# Patient Record
Sex: Female | Born: 1965 | Race: White | Hispanic: No | Marital: Married | Smoking: Former smoker
Health system: Southern US, Community
[De-identification: ages and names within clinical notes are randomized; demographics above are authoritative.]

## PROBLEM LIST (undated history)

## (undated) DIAGNOSIS — F32A Depression, unspecified: Secondary | ICD-10-CM

## (undated) DIAGNOSIS — K219 Gastro-esophageal reflux disease without esophagitis: Secondary | ICD-10-CM

## (undated) DIAGNOSIS — Z8719 Personal history of other diseases of the digestive system: Secondary | ICD-10-CM

## (undated) DIAGNOSIS — F329 Major depressive disorder, single episode, unspecified: Secondary | ICD-10-CM

## (undated) DIAGNOSIS — F419 Anxiety disorder, unspecified: Secondary | ICD-10-CM

## (undated) DIAGNOSIS — A64 Unspecified sexually transmitted disease: Secondary | ICD-10-CM

## (undated) DIAGNOSIS — E039 Hypothyroidism, unspecified: Secondary | ICD-10-CM

## (undated) HISTORY — PX: DIAGNOSTIC LAPAROSCOPY: SUR761

## (undated) HISTORY — PX: DILATION AND CURETTAGE OF UTERUS: SHX78

## (undated) HISTORY — DX: Unspecified sexually transmitted disease: A64

---

## 1999-06-14 HISTORY — PX: TUBAL LIGATION: SHX77

## 2007-09-17 ENCOUNTER — Other Ambulatory Visit: Admission: RE | Admit: 2007-09-17 | Discharge: 2007-09-17 | Payer: Self-pay | Admitting: Obstetrics and Gynecology

## 2010-04-02 DIAGNOSIS — K219 Gastro-esophageal reflux disease without esophagitis: Secondary | ICD-10-CM | POA: Insufficient documentation

## 2010-04-27 ENCOUNTER — Encounter: Payer: Self-pay | Admitting: Internal Medicine

## 2010-04-27 ENCOUNTER — Ambulatory Visit: Payer: Self-pay | Admitting: Gastroenterology

## 2010-04-27 DIAGNOSIS — R1013 Epigastric pain: Secondary | ICD-10-CM

## 2010-04-27 DIAGNOSIS — R1319 Other dysphagia: Secondary | ICD-10-CM | POA: Insufficient documentation

## 2010-05-10 ENCOUNTER — Ambulatory Visit (HOSPITAL_COMMUNITY): Admission: RE | Admit: 2010-05-10 | Discharge: 2010-05-10 | Payer: Self-pay | Admitting: Internal Medicine

## 2010-05-10 ENCOUNTER — Ambulatory Visit: Payer: Self-pay | Admitting: Internal Medicine

## 2010-05-11 DIAGNOSIS — Z9889 Other specified postprocedural states: Secondary | ICD-10-CM | POA: Insufficient documentation

## 2010-05-11 DIAGNOSIS — Q394 Esophageal web: Secondary | ICD-10-CM | POA: Insufficient documentation

## 2010-05-26 ENCOUNTER — Telehealth (INDEPENDENT_AMBULATORY_CARE_PROVIDER_SITE_OTHER): Payer: Self-pay

## 2010-07-01 ENCOUNTER — Telehealth (INDEPENDENT_AMBULATORY_CARE_PROVIDER_SITE_OTHER): Payer: Self-pay

## 2010-07-13 NOTE — Letter (Signed)
Summary: REFERRAL FROM DR Maryellen Pile  REFERRAL FROM DR EASON   Imported By: Rexene Alberts 04/27/2010 16:03:51  _____________________________________________________________________  External Attachment:    Type:   Image     Comment:   External Document

## 2010-07-13 NOTE — Letter (Signed)
Summary: EGD/ED ORDER  EGD/ED ORDER   Imported By: Rexene Alberts 04/27/2010 14:58:05  _____________________________________________________________________  External Attachment:    Type:   Image     Comment:   External Document

## 2010-07-15 NOTE — Assessment & Plan Note (Signed)
Summary: [facility] PAIN/LAW   Visit Type:  Initial Consult Referring Isaish Alemu:  [facility] Ashla Murph:  Kathlee Nations  CC:  Dysphagia and epigastric pain.  History of Present Illness: Stefanie Brown is a nice 45 year old female who presents today at the request of Dr. Kathlee Nations secondary to new onset of epigastric pain and dysphagia. 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 She reports being evaluated for cardiac etiology, which was negative. The last 2.5 months she notes the pain now resides in the epigastric region, and she has noted new onset of reflux. reports choking sensation despite chewing well and choosing soft foods. +dysphagia/odonyphagia. started zantac bid 2 mos ago without improvement. denies regular use of NSAIDs, ASA, goody's, bc. No other issues. no constipation, hematochezia, or melena. Feels that symptoms are worse during the day at school. under a lot of stress. See tests below:  UGI Oct 2011: small h.h. with GERD, otherwise negative [place] abd Oct 2011: negative for stones  Current Medications (verified): 1)  Levothroid 50 Mcg Tabs (Levothyroxine Sodium) .... Take 1 Tablet By Mouth Once A Day 2)  Prevacid 24hr 15 Mg Cpdr (Lansoprazole) .... Take 1 Tablet By Mouth Once A Day  Allergies (verified): 1)  ! * Bactrum 2)  ! Sulfa  Past History:  Past Medical History: Hypothyroidism  Past Surgical History: tubal ligation  Family History: Mother:living, Parkinsons Father:living, MI, prostate ca Siblings: 1 sister fibromyalgia No FH of Colon Cancer:  Social History: Occupation: Runner, broadcasting/film/video, 7th grade Patient is a former smoker. quit 2 years, six cigarettes/day Alcohol Use - yes, social Illicit Drug Use - no One son: 20 years oldSmoking Status:  quit Drug Use:  no  Review of Systems General:  Denies fever, chills, and anorexia. Eyes:   Denies irritation and discharge. ENT:  Complains of difficulty swallowing; denies sore throat and hoarseness. CV:  Denies chest pains and peripheral edema. Resp:  Denies dyspnea at rest, cough, and wheezing. GI:  Complains of difficulty swallowing, pain on swallowing, and abdominal pain; denies gas/bloating, constipation, change in bowel habits, bloody BM's, and black BMs. GU:  Denies urinary burning, blood in urine, and urinary frequency. MS:  Denies joint pain / LOM, joint swelling, and joint stiffness. Derm:  Denies rash, itching, and dry skin. Neuro:  Denies weakness, paralysis, and syncope. Psych:  Denies depression and anxiety. Endo:  Denies cold intolerance and heat intolerance. Heme:  Denies bruising, bleeding, and enlarged lymph nodes. Allergy:  Denies hives, hay fever, and recurrent infections.   Vital Signs:  Patient profile:   45 year old female Height:      67 inches Weight:      202 pounds BMI:     31.75 Temp:     99.2 degrees F oral Pulse rate:   60 / minute BP sitting:   118 / 88  (left arm) Cuff size:   regular  Vitals Entered By: Cloria Spring LPN (April 27, 2010 1:24 PM)  Physical Exam  General:  Well developed, well nourished, no acute distress. Head:  Normocephalic and atraumatic. Eyes:  without scleral icterus Mouth:  No deformity or lesions, dentition normal. Lungs:  Clear throughout to auscultation. Heart:  Regular rate and rhythm; no murmurs, rubs,  or bruits. Abdomen:  normal bowel sounds, obese, without guarding, without rebound, no distesion, no tenderness, and no hepatomegally or splenomegaly.   Msk:  Symmetrical  with no gross deformities. Normal posture. Pulses:  Normal pulses noted. Extremities:  No clubbing, cyanosis, edema or deformities noted. Neurologic:  Alert and  oriented x4;  grossly normal neurologically. Skin:  Intact without significant lesions or rashes. Psych:  Alert and cooperative. Normal mood and affect.  Impression &  Recommendations:  Problem # 1:  ABDOMINAL PAIN-EPIGASTRIC (ICD-27.24)  45 year old pleasant Caucasian female with new onset of abdominal pain in March, now mainly epigastric pain, worsened over past 2.5 months, +dysphagia/odynophagia, reflux X 1 week, started on Zantac 150 two times a day two months ago, no resolution of symptoms or lessening of severity. UGI showed hiatal hernia, [place] no stones or other etiology. no prior egd in past. no hx of reflux/dysphagia.  EGD withposs ED with Dr. Jena Gauss: the risks and benefits were discussed in detail, and she desires to proceed. verbal consent obtained. Nexium 40 mg by mouth daily, 2 weeks worth given  Orders: Consultation Level III ([id number])  Problem # 2:  DYSPHAGIA (EXB-284.13)  see # 1.  Orders: Consultation Level III ([id number])  Problem # 3:  GERD (ICD-530.81)  see # 1.   Orders: Consultation Level III [phone]) We would like to thank Dr. Kathlee Nations for the referral of this pleasant lady. Future recommendations will be discussed after EGD with possible dilation.  Appended Document: Orders Update    Clinical Lists Changes  Orders: Added new Service order of Consultation Level III [phone]) - Signed

## 2010-07-15 NOTE — Progress Notes (Signed)
Summary: nexium not helping  Phone Note Call from Patient Call back at Home Phone [phone]   Caller: Patient Summary of Call: pt called- she is having increased burning in chest and throat. She stated it feels like she is getting alot worse. She feels like the Nexium is not helping at all. pt wants to know if there is something else she can try other than the Nexium? please advise Initial call taken by: Hendricks Limes LPN,  July 01, 2010 11:43 AM     Appended Document: nexium not helping OK; we can try some Dexilant 60 mg cap - one cap daily x 2 week trial - then telephone f/u  Appended Document: nexium not helping called pt- left detailed message on cell #. samples at front desk  Appended Document: nexium not helping pt called- she will try samples

## 2010-07-15 NOTE — Progress Notes (Signed)
Summary: nexium rx  Phone Note Call from Patient Call back at Home Phone [phone]   Caller: Patient Summary of Call: pt is requesting Nexium rx called to [facility] pharmacy in [place]. She is out comepletely of samples and needs rx today if possible. pt is requesting a call to let her know when its called in.  please advise Initial call taken by: Hendricks Limes LPN,  May 26, 2010 4:10 PM     Appended Document: nexium rx ok; nexium 40 mg daily - disp #30 - 40 mg once daily. refill x 6  Appended Document: nexium rx Called Rx to [facility] in [place]. LMOM for pt that it was called in.

## 2010-07-26 ENCOUNTER — Telehealth (INDEPENDENT_AMBULATORY_CARE_PROVIDER_SITE_OTHER): Payer: Self-pay

## 2010-08-02 ENCOUNTER — Ambulatory Visit: Payer: Self-pay | Admitting: Gastroenterology

## 2010-08-02 ENCOUNTER — Telehealth (INDEPENDENT_AMBULATORY_CARE_PROVIDER_SITE_OTHER): Payer: Self-pay | Admitting: *Deleted

## 2010-08-04 NOTE — Progress Notes (Signed)
Summary: pt wants ov- dexilant not helping  Phone Note Call from Patient Call back at [facility] Phone [phone]   Caller: Patient Summary of Call: pt called- Dexilant is not helping all day either, by 9pm pt is in so much pain in her throat that she starts crying. pt is requesting an appt. 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 pt stated she has never tried any of her ppi's two times a day because they were always too expensive (pt is paying 70.00 a month for once daily dosing) please advise Initial call taken by: Hendricks Limes LPN,  July 26, 2010 12:28 PM     Appended Document: pt wants ov- dexilant not helping Lets get her in  the 17th or 20 th.  Appended Document: pt wants ov- dexilant not helping LMOM for pt to come in on 2/20 @ 230pm w/AS (3pm urgent spot)

## 2010-08-10 NOTE — Progress Notes (Addendum)
  Phone Note Call from Patient   Summary of Call: Pt called to cancel her appt. She stated that she wanted to try the Dexilant for another month and needs [place] to call in [facility] [place], the # is [phone]. Pt can be reached at [phone].  Initial call taken by: Peggyann Shoals,  August 02, 2010 12:02 PM     Appended Document: dexilant  Needs OV 1 month  Prescriptions: DEXILANT 60 MG CPDR (DEXLANSOPRAZOLE) 1 by mouth daily  #31 x 1   Entered and Authorized by:   Joselyn Arrow FNP-BC   Signed by:   Joselyn Arrow FNP-BC on 08/02/2010   Method used:   [facility]  [address] [phone][facility] (retail)       [address]       [place], [place]  [address]       Ph: [phone]       Fax: [phone]   RxID:   [id number]     Appended Document:  mailed letter for pt to call office to set up her OV to further her refills

## 2010-08-16 ENCOUNTER — Encounter: Payer: Self-pay | Admitting: Gastroenterology

## 2010-08-24 NOTE — Medication Information (Signed)
Summary: RX Folder  RX Folder   Imported By: Peggyann Shoals 08/16/2010 12:58:33  _____________________________________________________________________  External Attachment:    Type:   Image     Comment:   External Document

## 2010-08-27 ENCOUNTER — Encounter (INDEPENDENT_AMBULATORY_CARE_PROVIDER_SITE_OTHER): Payer: Self-pay | Admitting: *Deleted

## 2010-08-31 NOTE — Letter (Signed)
Summary: Recall Office Visit  [facility]  [address]   [place], [place] [address]   Phone: [phone]  Fax: [phone]      August 27, 2010   SHARVI MOONEYHAN [address] [place], [place]  [address] [birth year]   Dear Ms. Okelley,   According to our records, it is time for you to schedule a follow-up office visit with [place].   At your convenience, please call [phone] to schedule an office visit. If you have any questions, concerns, or feel that this letter is in error, we would appreciate your call.   Sincerely,    Diana Eves  [facility] Ph: [phone]   Fax: [phone]

## 2012-12-20 DIAGNOSIS — I10 Essential (primary) hypertension: Secondary | ICD-10-CM | POA: Insufficient documentation

## 2013-02-06 ENCOUNTER — Ambulatory Visit (INDEPENDENT_AMBULATORY_CARE_PROVIDER_SITE_OTHER): Payer: BC Managed Care – PPO | Admitting: Certified Nurse Midwife

## 2013-02-06 ENCOUNTER — Encounter: Payer: Self-pay | Admitting: Certified Nurse Midwife

## 2013-02-06 VITALS — BP 126/82 | HR 92 | Ht 67.0 in | Wt 204.0 lb

## 2013-02-06 DIAGNOSIS — Z01419 Encounter for gynecological examination (general) (routine) without abnormal findings: Secondary | ICD-10-CM

## 2013-02-06 NOTE — Patient Instructions (Addendum)

## 2013-02-06 NOTE — Progress Notes (Signed)
Patient ID: Stefanie Brown, female   DOB: 18-Jul-1965, 47 y.o.   MRN: [id number] 47 y.o. [id number] MarriedCaucasianF here for annual exam.   Periods duration has increased to 5 days with cramping for first 2 days only. Moderate bleeding days 1 and 2 only. Desires no cycle control. OTC NSAID works well.  Sees PCP for aex, labs and thyroid management. No other health issues today.  Patient's last menstrual period was 12/14/2012.          Sexually active: yes  The current method of family planning is tubal ligation.    Exercising: yes  Swimming occasionally Smoker:  Former, quit in 2010  Health Maintenance: Pap:  02/02/12, WNL neg HR HPV History of abnormal Pap:  no MMG:  01/09/13, BI-Rads 1 negative Colonoscopy:  never BMD:   never TDaP:  2007 Screening Labs: PCP, Hb today: PCPP, Urine today: PCP   reports that she quit smoking about 4 years ago. Her smoking use included Cigarettes. She has a 12.5 pack-year smoking history. She does not have any smokeless tobacco history on file. She reports that she drinks about 1.5 ounces of alcohol per week. She reports that she does not use illicit drugs.  Past Medical History  Diagnosis Date  . Thyroid disease     Hypothyroid  . STD (sexually transmitted disease)     HSV 2    Past Surgical History  Procedure Laterality Date  . Tubal ligation Bilateral 2001    Current Outpatient Prescriptions  Medication Sig Dispense Refill  . cetirizine (ZYRTEC) 10 MG tablet Take 10 mg by mouth daily.      Marland Kitchen levothyroxine (SYNTHROID, LEVOTHROID) 75 MCG tablet Take 1 tablet by mouth daily.       No current facility-administered medications for this visit.    Family History  Problem Relation Age of Onset  . Parkinson's disease Mother   . Hypertension Father   . Heart attack Father     ROS:  Pertinent items are noted in HPI.  Otherwise, a comprehensive ROS was negative.  Exam:   BP 126/82  Pulse 92  Ht 5\' 7"  (1.702 m)  Wt 204 lb (92.534 kg)  BMI 31.94  kg/m2  LMP 12/14/2012  Weight change:  Height:   Height: 5\' 7"  (170.2 cm)  Ht Readings from Last 3 Encounters:  02/06/13 5\' 7"  (1.702 m)  04/27/10 5\' 7"  (1.702 m)    General appearance: alert, cooperative and appears stated age Head: Normocephalic, without obvious abnormality, atraumatic Neck: no adenopathy, supple, symmetrical, trachea midline and thyroid normal to inspection and palpation Lungs: clear to auscultation bilaterally Breasts: normal appearance, no masses or tenderness, No nipple retraction or dimpling, No nipple discharge or bleeding, No axillary or supraclavicular adenopathy Heart: regular rate and rhythm Abdomen: soft, non-tender; bowel sounds normal; no masses,  no organomegaly Extremities: extremities normal, atraumatic, no cyanosis or edema Skin: Skin color, texture, turgor normal. No rashes or lesions Lymph nodes: Cervical, supraclavicular, and axillary nodes normal. No abnormal inguinal nodes palpated Neurologic: Grossly normal   Pelvic: External genitalia:  no lesions              Urethra:  normal appearing urethra with no masses, tenderness or lesions              Bartholins and Skenes: normal                 Vagina: normal appearing vagina with normal color and discharge, no lesions  Cervix: normal, non tender              Pap taken: no Bimanual Exam:  Uterus:  normal size, contour, position, consistency, mobility, non-tender and mid position              Adnexa: normal adnexa and no mass, fullness, tenderness               Rectovaginal: Confirms               Anus:  normal sphincter tone, no lesions  A:  Well Woman with normal exam  Hypothyroid stable medication with PCP management    P:   Reviewed health and wellness pertinent to exam  Discussed perimenopausal cycle changes, etiology, bleeding profile. Given menses calendar to record with normal and abnormal parameters.  Continue follow as indicated   Mammogram yearly pap smear not taken  today  counseled on breast self exam, mammography screening, adequate intake of calcium and vitamin D, diet and exercise, Kegel's exercises  return annually or prn  An After Visit Summary was printed and given to the patient.

## 2013-02-07 NOTE — Progress Notes (Signed)
Note reviewed, agree with plan.  Lylee Corrow, MD  

## 2013-04-29 ENCOUNTER — Encounter: Payer: Self-pay | Admitting: Certified Nurse Midwife

## 2013-09-11 ENCOUNTER — Telehealth: Payer: Self-pay | Admitting: Certified Nurse Midwife

## 2013-09-11 NOTE — Telephone Encounter (Signed)
Spoke with patient. Patient has been experiencing pain in both armpits for a few months with some itching. Has recently quit smoking and has gained some weight as a result. Pain was originally only coming right before cycle but is now coming daily. Patient states "I feel it may be hormonal and I may be about to go through menopause." Has not switched soaps or laundry detergents. Is not currently on any form of birth control. Has been taking advil occasionally to relieve pain. Offered appointment with Regina Eck CNM as early as today or tomorrow. Patient states that she is out of town and will not be back until Saturday. Requesting late afternoon appointment. Appointment made for 4/7 at 1600 with Regina Eck CNM. Patient is agreeable and verbalizes understanding.  Routing to provider for final review. Patient agreeable to disposition. Will close encounter

## 2013-09-11 NOTE — Telephone Encounter (Signed)
Pt says she is having a lot of pain in both armpits.

## 2013-09-17 ENCOUNTER — Ambulatory Visit (INDEPENDENT_AMBULATORY_CARE_PROVIDER_SITE_OTHER): Payer: BC Managed Care – PPO | Admitting: Certified Nurse Midwife

## 2013-09-17 ENCOUNTER — Encounter: Payer: Self-pay | Admitting: Certified Nurse Midwife

## 2013-09-17 VITALS — BP 110/68 | HR 72 | Resp 16 | Ht 67.0 in | Wt 207.0 lb

## 2013-09-17 DIAGNOSIS — N644 Mastodynia: Secondary | ICD-10-CM

## 2013-09-17 NOTE — Progress Notes (Signed)
48 y.o. Married Caucasian female G2P1011 here with complaint of pain under both arms, for the past two months. Denies breast masses, or nipple discharge or skin change. Patient was swimming for exercise four months ago and she stopped swimming about the time the soreness became worse. Patient wears under wire bra and admits to weight gain over the past few months, which has made her bra tighter. Patient denies redness or swelling under arms or lesions. "It just feels sore". Patient took Advil a few days ago for discomfort and "it did help". Denies any heavy lifting or injury, or excess caffeine use.. Last mammogram 01/09/2013 negative. Mother recently diagnosed with bladder cancer and patient traveling a lot to help her. No other issues.   O: Healthy WD,WN female Affect: Normal Breast exam bilateral: no masses, skin change, nipple discharge or asymmetry noted. No tenderness noted except on the very outer edge of breast leading to the axilla. Axilla bilateral tender, no redness, no axillary lymph node enlargement, no lesions, no edema noted. ROM of arms does cause tenderness under arms bilateral.  A:Mastalgia with axillary tenderness Normal breast and axillary exam except for tenderness.   P: Discussed findings of normal exam with tenderness only. Discussed could be exercise or under wire compression as causative agent. Discussed removed under wire in same bra and see if discomfort improves. Also start on Advil 600 mg every 6 hours for the next 24 to see if improvement noted. Avoid heavy lifting. Recommend diagnostic mammogram due breast involvement. Patient agreeable. Will schedule diagnostic mammogram and notify patient of appointment.  Rv prn

## 2013-09-17 NOTE — Patient Instructions (Signed)
Breast Tenderness Breast tenderness is a common problem for women of all ages. Breast tenderness may cause mild discomfort to severe pain. It has a variety of causes. Your health care provider will find out the likely cause of your breast tenderness by examining your breasts, asking you about symptoms, and ordering some tests. Breast tenderness usually does not mean you have breast cancer. HOME CARE INSTRUCTIONS  Breast tenderness often can be handled at home. You can try:  Getting fitted for a new bra that provides more support, especially during exercise.  Wearing a more supportive bra or sports bra while sleeping when your breasts are very tender.  If you have a breast injury, apply ice to the area:  Put ice in a plastic bag.  Place a towel between your skin and the bag.  Leave the ice on for 20 minutes, 2 3 times a day.  If your breasts are too full of milk as a result of breastfeeding, try:  Expressing milk either by hand or with a breast pump.  Applying a warm compress to the breasts for relief.  Taking over-the-counter pain relievers, if approved by your health care provider.  Taking other medicines that your health care provider prescribes. These may include antibiotic medicines or birth control pills. Over the long term, your breast tenderness might be eased if you:  Cut down on caffeine.  Reduce the amount of fat in your diet. Keep a log of the days and times when your breasts are most tender. This will help you and your health care provider find the cause of the tenderness and how to relieve it. Also, learn how to do breast exams at home. This will help you notice if you have an unusual growth or lump that could cause tenderness. SEEK MEDICAL CARE IF:   Any part of your breast is hard, red, and hot to the touch. This could be a sign of infection.  Fluid is coming out of your nipples (and you are not breastfeeding). Especially watch for blood or pus.  You have a fever  as well as breast tenderness.  You have a new or painful lump in your breast that remains after your menstrual period ends.  You have tried to take care of the pain at home, but it has not gone away.  Your breast pain is getting worse, or the pain is making it hard to do the things you usually do during your day. Document Released: 05/12/2008 Document Revised: 01/30/2013 Document Reviewed: 12/27/2012 ExitCare Patient Information 2014 [employer].  

## 2013-09-18 ENCOUNTER — Telehealth: Payer: Self-pay | Admitting: Emergency Medicine

## 2013-09-18 NOTE — Telephone Encounter (Signed)
Called Stefanie Brown and scheduled patient for 4/9 at 2:45 at [place]. [address], [place] phone is [phone]. Order faxed.  Message left to return call to [place] at [phone].

## 2013-09-18 NOTE — Telephone Encounter (Signed)
Attempted phone call to patient and no answer.

## 2013-09-18 NOTE — Telephone Encounter (Signed)
Message copied by Michele Mcalpine on Wed Sep 18, 2013  1:07 PM ------      Message from: Regina Eck      Created: Tue Sep 17, 2013  5:42 PM       Please schedule diagnostic mammogram for [place]      Bilateral breast tenderness persistent with axillary invovement      See order      Thank you ------

## 2013-09-18 NOTE — Progress Notes (Signed)
Reviewed personally.  M. Suzanne Angas Isabell, MD.  

## 2013-09-19 NOTE — Telephone Encounter (Signed)
Message left to return call to [place] at [phone].   Unable to reach patient. [facility] and they will leave appointment as it is for now, patient will not be charged a no show fee. I will continue to try to reach her and r/s as necessary.

## 2013-09-24 NOTE — Telephone Encounter (Signed)
Spoke with [facility] Rep, they state patient is scheduled for 4/15 at 1500.

## 2013-10-04 NOTE — Telephone Encounter (Signed)
Spoke with Judeen Hammans at [place], requested copies of imaging to be faxed.

## 2013-10-04 NOTE — Telephone Encounter (Signed)
Message copied by Michele Mcalpine on Fri Oct 04, 2013  1:22 PM ------      Message from: Regina Eck      Created: Tue Sep 17, 2013  5:42 PM       Please schedule diagnostic mammogram for Stefanie Brown      Bilateral breast tenderness persistent with axillary invovement      See order      Thank you ------

## 2013-10-08 NOTE — Telephone Encounter (Signed)
Copies of images to Regina Eck CNM to review.   Will close encounter.

## 2013-10-10 ENCOUNTER — Telehealth: Payer: Self-pay | Admitting: Emergency Medicine

## 2013-10-10 NOTE — Telephone Encounter (Signed)
Calling patient to discuss written result note from [place], reviewed mammogram report and no abnormality seen. Would like to do two month breast check.   Patient states she has changed bras and is now "75 percent better". Advised of message from Debbi and patient asks to call back to schedule follow up breast check. 2 month recall entered. Will route hard copies to Dr. Sabra Heck for review out of hold.   Routing to provider for final review. Patient agreeable to disposition. Will close encounter

## 2013-10-11 NOTE — Telephone Encounter (Signed)
agree

## 2013-11-09 ENCOUNTER — Emergency Department (HOSPITAL_COMMUNITY): Payer: BC Managed Care – PPO

## 2013-11-09 ENCOUNTER — Emergency Department (HOSPITAL_COMMUNITY)
Admission: EM | Admit: 2013-11-09 | Discharge: 2013-11-09 | Disposition: A | Discharge status: Home / Self Care | Payer: BC Managed Care – PPO | Attending: Emergency Medicine | Admitting: Emergency Medicine

## 2013-11-09 ENCOUNTER — Encounter (HOSPITAL_COMMUNITY): Payer: Self-pay | Admitting: Emergency Medicine

## 2013-11-09 DIAGNOSIS — R109 Unspecified abdominal pain: Secondary | ICD-10-CM

## 2013-11-09 DIAGNOSIS — R319 Hematuria, unspecified: Secondary | ICD-10-CM | POA: Insufficient documentation

## 2013-11-09 DIAGNOSIS — R350 Frequency of micturition: Secondary | ICD-10-CM | POA: Insufficient documentation

## 2013-11-09 DIAGNOSIS — E039 Hypothyroidism, unspecified: Secondary | ICD-10-CM | POA: Insufficient documentation

## 2013-11-09 DIAGNOSIS — Z79899 Other long term (current) drug therapy: Secondary | ICD-10-CM | POA: Insufficient documentation

## 2013-11-09 DIAGNOSIS — R300 Dysuria: Secondary | ICD-10-CM | POA: Insufficient documentation

## 2013-11-09 DIAGNOSIS — Z87891 Personal history of nicotine dependence: Secondary | ICD-10-CM | POA: Insufficient documentation

## 2013-11-09 LAB — COMPREHENSIVE METABOLIC PANEL
ALBUMIN: 3.8 g/dL (ref 3.5–5.2)
ALK PHOS: 75 U/L (ref 39–117)
ALT: 22 U/L (ref 0–35)
AST: 15 U/L (ref 0–37)
BILIRUBIN TOTAL: 0.3 mg/dL (ref 0.3–1.2)
BUN: 10 mg/dL (ref 6–23)
CHLORIDE: 104 meq/L (ref 96–112)
CO2: 25 mEq/L (ref 19–32)
Calcium: 9.7 mg/dL (ref 8.4–10.5)
Creatinine, Ser: 0.71 mg/dL (ref 0.50–1.10)
GFR calc Af Amer: >90 mL/min (ref >90)
GFR calc non Af Amer: >90 mL/min (ref >90)
Glucose, Bld: 90 mg/dL (ref 70–99)
POTASSIUM: 3.7 meq/L (ref 3.7–5.3)
Sodium: 143 mEq/L (ref 137–147)
Total Protein: 7.1 g/dL (ref 6.0–8.3)

## 2013-11-09 LAB — CBC WITH DIFFERENTIAL/PLATELET
BASOS PCT: 0 % (ref 0–1)
Basophils Absolute: 0 10*3/uL (ref 0.0–0.1)
Eosinophils Absolute: 0 10*3/uL (ref 0.0–0.7)
Eosinophils Relative: 1 % (ref 0–5)
HCT: 37.5 % (ref 36.0–46.0)
HEMOGLOBIN: 12.8 g/dL (ref 12.0–15.0)
LYMPHS ABS: 1.9 10*3/uL (ref 0.7–4.0)
LYMPHS PCT: 23 % (ref 12–46)
MCH: 27.9 pg (ref 26.0–34.0)
MCHC: 34.1 g/dL (ref 30.0–36.0)
MCV: 81.7 fL (ref 78.0–100.0)
MONOS PCT: 7 % (ref 3–12)
Monocytes Absolute: 0.6 10*3/uL (ref 0.1–1.0)
NEUTROS ABS: 5.8 10*3/uL (ref 1.7–7.7)
NEUTROS PCT: 69 % (ref 43–77)
Platelets: 220 10*3/uL (ref 150–400)
RBC: 4.59 MIL/uL (ref 3.87–5.11)
RDW: 13.1 % (ref 11.5–15.5)
WBC: 8.3 10*3/uL (ref 4.0–10.5)

## 2013-11-09 LAB — URINALYSIS, ROUTINE W REFLEX MICROSCOPIC
Bilirubin Urine: NEGATIVE
GLUCOSE, UA: NEGATIVE mg/dL
KETONES UR: 15 mg/dL — AB
LEUKOCYTES UA: NEGATIVE
Nitrite: NEGATIVE
PROTEIN: NEGATIVE mg/dL
Specific Gravity, Urine: 1.019 (ref 1.005–1.030)
Urobilinogen, UA: 1 mg/dL (ref 0.0–1.0)
pH: 6 (ref 5.0–8.0)

## 2013-11-09 LAB — URINE MICROSCOPIC-ADD ON

## 2013-11-09 MED ORDER — CEPHALEXIN 500 MG PO CAPS: 500.0000 mg | ORAL_CAPSULE | Freq: Four times a day (QID) | ORAL | Status: DC

## 2013-11-09 MED ORDER — CEPHALEXIN 250 MG PO CAPS
500.0000 mg | ORAL_CAPSULE | Freq: Once | ORAL | Status: AC
  Administered 2013-11-09: 500 mg via ORAL
  Filled 2013-11-09: qty 2

## 2013-11-09 MED ORDER — TRAMADOL HCL 50 MG PO TABS: 50.0000 mg | ORAL_TABLET | Freq: Four times a day (QID) | ORAL | Status: DC | PRN

## 2013-11-09 NOTE — ED Provider Notes (Signed)
CSN: [id number]     Arrival date & time 11/09/13  1746 History   First MD Initiated Contact with Patient 11/09/13 1818     Chief Complaint  Patient presents with  . Flank Pain  . Abdominal Pain     (Consider location/radiation/quality/duration/timing/severity/associated sxs/prior Treatment) Patient is a 48 y.o. female presenting with flank pain and abdominal pain. The history is provided by the patient.  Flank Pain Associated symptoms include abdominal pain. Pertinent negatives include no chest pain, no headaches and no shortness of breath.  Abdominal Pain Associated symptoms: dysuria, hematuria and nausea   Associated symptoms: no chest pain, no fever, no shortness of breath and no vomiting    the patient is from the [place] area. Patient's had several months of bilateral flank pain predominant right flank pain right lower abdominal pain. Patient was seen by her regular doctor yesterday and was told that she had some blood in her urine. Patient was referred to urology. Patient reports nausea but no vomiting. Patient's appetite and decreased. Patient's had urinary frequency and burning with urination. No gross hematuria. Pain is been intermittent. No fevers.  Past Medical History  Diagnosis Date  . Thyroid disease     Hypothyroid  . STD (sexually transmitted disease)     HSV 2   Past Surgical History  Procedure Laterality Date  . Tubal ligation Bilateral 2001   Family History  Problem Relation Age of Onset  . Parkinson's disease Mother   . Cancer Mother     bladder  . Hypertension Father   . Heart attack Father    History  Substance Use Topics  . Smoking status: Former Smoker -- 0.50 packs/day for 25 years    Types: Cigarettes    Quit date: 06/13/2008  . Smokeless tobacco: Not on file  . Alcohol Use: 1.5 oz/week    3 drink(s) per week   OB History   Grav Para Term Preterm Abortions TAB SAB Ect Mult Living   2 1 1  1  1   1      Review of Systems   Constitutional: Positive for appetite change. Negative for fever.  HENT: Negative for congestion.   Eyes: Negative for photophobia.  Respiratory: Negative for shortness of breath.   Cardiovascular: Negative for chest pain and leg swelling.  Gastrointestinal: Positive for nausea and abdominal pain. Negative for vomiting.  Genitourinary: Positive for dysuria, frequency, hematuria and flank pain.  Musculoskeletal: Positive for back pain. Negative for neck pain.  Skin: Negative for rash.  Neurological: Negative for headaches.  Hematological: Does not bruise/bleed easily.  Psychiatric/Behavioral: Negative for confusion.      Allergies  Sulfamethoxazole-trimethoprim and Sulfonamide derivatives  Home Medications   Prior to Admission medications   Medication Sig Start Date End Date Taking? Authorizing Provider  cetirizine (ZYRTEC) 10 MG tablet Take 10 mg by mouth daily.   Yes Historical Provider, MD  ibuprofen (ADVIL,MOTRIN) 200 MG tablet Take 600 mg by mouth every 6 (six) hours as needed for moderate pain.   Yes Historical Provider, MD  levothyroxine (SYNTHROID, LEVOTHROID) 75 MCG tablet Take 75 mcg by mouth daily before breakfast.  01/28/13  Yes Historical Provider, MD  omeprazole (PRILOSEC) 20 MG capsule Take 20 mg by mouth daily.   Yes Historical Provider, MD  cephALEXin (KEFLEX) 500 MG capsule Take 1 capsule (500 mg total) by mouth 4 (four) times daily. 11/09/13   Fredia Sorrow, MD  traMADol (ULTRAM) 50 MG tablet Take 1 tablet (50 mg total) by mouth  every 6 (six) hours as needed. 11/09/13   Fredia Sorrow, MD   BP 159/107  Pulse 91  Temp(Src) 97.8 F (36.6 C) (Oral)  Resp 18  Ht 5\' 8"  (1.727 m)  Wt 205 lb (92.987 kg)  BMI 31.18 kg/m2  SpO2 100%  LMP 10/20/2013 Physical Exam  Nursing note and vitals reviewed. Constitutional: She is oriented to person, place, and time. She appears well-developed and well-nourished. No distress.  HENT:  Head: Normocephalic and atraumatic.   Mouth/Throat: Oropharynx is clear and moist.  Eyes: Conjunctivae and EOM are normal. Pupils are equal, round, and reactive to light.  Neck: Normal range of motion.  Cardiovascular: Normal rate, regular rhythm and normal heart sounds.   No murmur heard. Pulmonary/Chest: Effort normal and breath sounds normal. No respiratory distress.  Abdominal: Soft. Bowel sounds are normal. There is no tenderness.  Musculoskeletal: Normal range of motion. She exhibits no edema.  Neurological: She is alert and oriented to person, place, and time. No cranial nerve deficit. She exhibits normal muscle tone. Coordination normal.  Skin: Skin is warm. No erythema.    ED Course  Procedures (including critical care time) Labs Review Labs Reviewed  URINALYSIS, ROUTINE W REFLEX MICROSCOPIC - Abnormal; Notable for the following:    Hgb urine dipstick MODERATE (*)    Ketones, ur 15 (*)    All other components within normal limits  URINE CULTURE  CBC WITH DIFFERENTIAL  COMPREHENSIVE METABOLIC PANEL  URINE MICROSCOPIC-ADD ON   Results for orders placed during the hospital encounter of 11/09/13  CBC WITH DIFFERENTIAL      Result Value Ref Range   WBC 8.3  4.0 - 10.5 K/uL   RBC 4.59  3.87 - 5.11 MIL/uL   Hemoglobin 12.8  12.0 - 15.0 g/dL   HCT 37.5  36.0 - 46.0 %   MCV 81.7  78.0 - 100.0 fL   MCH 27.9  26.0 - 34.0 pg   MCHC 34.1  30.0 - 36.0 g/dL   RDW 13.1  11.5 - 15.5 %   Platelets 220  150 - 400 K/uL   Neutrophils Relative % 69  43 - 77 %   Neutro Abs 5.8  1.7 - 7.7 K/uL   Lymphocytes Relative 23  12 - 46 %   Lymphs Abs 1.9  0.7 - 4.0 K/uL   Monocytes Relative 7  3 - 12 %   Monocytes Absolute 0.6  0.1 - 1.0 K/uL   Eosinophils Relative 1  0 - 5 %   Eosinophils Absolute 0.0  0.0 - 0.7 K/uL   Basophils Relative 0  0 - 1 %   Basophils Absolute 0.0  0.0 - 0.1 K/uL  COMPREHENSIVE METABOLIC PANEL      Result Value Ref Range   Sodium 143  137 - 147 mEq/L   Potassium 3.7  3.7 - 5.3 mEq/L   Chloride 104   96 - 112 mEq/L   CO2 25  19 - 32 mEq/L   Glucose, Bld 90  70 - 99 mg/dL   BUN 10  6 - 23 mg/dL   Creatinine, Ser 0.71  0.50 - 1.10 mg/dL   Calcium 9.7  8.4 - 10.5 mg/dL   Total Protein 7.1  6.0 - 8.3 g/dL   Albumin 3.8  3.5 - 5.2 g/dL   AST 15  0 - 37 U/L   ALT 22  0 - 35 U/L   Alkaline Phosphatase 75  39 - 117 U/L   Total Bilirubin 0.3  0.3 - 1.2 mg/dL   GFR calc non Af Amer >90  >90 mL/min   GFR calc Af Amer >90  >90 mL/min  URINALYSIS, ROUTINE W REFLEX MICROSCOPIC      Result Value Ref Range   Color, Urine YELLOW  YELLOW   APPearance CLEAR  CLEAR   Specific Gravity, Urine 1.019  1.005 - 1.030   pH 6.0  5.0 - 8.0   Glucose, UA NEGATIVE  NEGATIVE mg/dL   Hgb urine dipstick MODERATE (*) NEGATIVE   Bilirubin Urine NEGATIVE  NEGATIVE   Ketones, ur 15 (*) NEGATIVE mg/dL   Protein, ur NEGATIVE  NEGATIVE mg/dL   Urobilinogen, UA 1.0  0.0 - 1.0 mg/dL   Nitrite NEGATIVE  NEGATIVE   Leukocytes, UA NEGATIVE  NEGATIVE  URINE MICROSCOPIC-ADD ON      Result Value Ref Range   Squamous Epithelial / LPF RARE  RARE   WBC, UA 0-2  <3 WBC/hpf   RBC / HPF 3-6  <3 RBC/hpf   Bacteria, UA RARE  RARE     Imaging Review Ct Abdomen Pelvis Wo Contrast  11/09/2013   CLINICAL DATA:  48 year old female with flank pain lower abdominal pain. Initial encounter.  EXAM: CT ABDOMEN AND PELVIS WITHOUT CONTRAST  TECHNIQUE: Multidetector CT imaging of the abdomen and pelvis was performed following the standard protocol without IV contrast.  COMPARISON:  None.  FINDINGS: Mild motion artifact at the lung bases which appear normal. No pericardial or pleural effusion.  2 cm dermis and subcutaneous fat associated, fluid density nodule to the right of the xiphoid probably is a sebaceous cyst or similar benign entity.  Intermittent disc degeneration in the spine. No acute osseous abnormality identified.  Numerous pelvic phleboliths. Negative distal colon. Uterus and adnexa within normal limits. Small amount of gas in the  vagina.  Negative left colon, transverse colon and right colon. Normal, diminutive appendix. Normal terminal ileum. No dilated small bowel. Decompressed stomach and duodenum.  Negative non contrast liver, gallbladder, spleen, pancreas and adrenal glands. No abdominal free fluid.  No hydronephrosis or nephrolithiasis. No perinephric stranding. No hydroureter or or periureteral stranding. The bladder is completely decompressed. No lymphadenopathy identified.  IMPRESSION: Negative non contrast CT abdomen and pelvis.   Electronically Signed   By: Lars Pinks M.D.   On: 11/09/2013 20:18     EKG Interpretation None      MDM   Final diagnoses:  Hematuria  Flank pain    Workup for the flank pain and hematuria without specific findings. We'll treat the small amount of blood in the urine which is microscopic 4 possible urinary tract infection. Take antibiotic we'll treat with Keflex. Will give patient first dose here. Also tramadol for the flank pain. Patient is followed by urology in the [place] area. Important to followup to make sure that the blood and urine clears if it does not additional workup will be required.     Fredia Sorrow, MD 11/09/13 2122

## 2013-11-09 NOTE — ED Notes (Signed)
Pt c/o B/L flank pain and right lower abdominal pain onset several months. Pt seen by PMD yesterday and had a urine test done that showed some blood in urine but no white blood cells. Pt reports nausea, and that she has not eaten much last night or today. Pt reports urinary frequency, and burning with urination.

## 2013-11-09 NOTE — ED Notes (Signed)
Pt comfortable with discharge and follow up instructions. Prescriptions x2. 

## 2013-11-09 NOTE — Discharge Instructions (Signed)
Workup here tonight negative except for the blood in the urine that we discussed. Will give a trial of antibiotic to see if it clears the urine. Take the Keflex for the next 7 days as directed. Take the tramadol as needed for pain. Keep your appointment with urology in the [place] area. Return for any newer worse symptoms. CT scan of the abdomen and pelvis showed no abnormalities.

## 2013-11-11 LAB — URINE CULTURE

## 2014-02-10 ENCOUNTER — Ambulatory Visit: Payer: BC Managed Care – PPO | Admitting: Certified Nurse Midwife

## 2014-02-14 ENCOUNTER — Encounter: Payer: Self-pay | Admitting: Certified Nurse Midwife

## 2014-02-14 ENCOUNTER — Ambulatory Visit (INDEPENDENT_AMBULATORY_CARE_PROVIDER_SITE_OTHER): Payer: BC Managed Care – PPO | Admitting: Certified Nurse Midwife

## 2014-02-14 VITALS — BP 122/80 | HR 64 | Resp 16 | Ht 67.25 in | Wt 209.0 lb

## 2014-02-14 DIAGNOSIS — Z124 Encounter for screening for malignant neoplasm of cervix: Secondary | ICD-10-CM

## 2014-02-14 DIAGNOSIS — Z01419 Encounter for gynecological examination (general) (routine) without abnormal findings: Secondary | ICD-10-CM

## 2014-02-14 NOTE — Patient Instructions (Signed)

## 2014-02-14 NOTE — Progress Notes (Signed)
48 y.o. G55P1011 Married Caucasian Fe here for annual exam. Periods are changing now, with increase amount and cramping.Duration only 5 days. Has had increase PMS, but dealing with. Busy with caring for parents and working full time! Sees PCP for aex/labs and Thyroid medication management. Sent to urology for cystoscope for hematuria, negative findings. All breast tenderness resolved with stopping underwire bra use. No other health issues today. Starting grad school in spring.![place] 2 outbreaks.  Patient's last menstrual period was 02/05/2014.          Sexually active: Yes.    The current method of family planning is tubal ligation.    Exercising: No.  exercise Smoker:  no  Health Maintenance: Pap:  02-02-12 neg HPV HR neg MMG: 09-25-13 mammo & f/u u/s neg Colonoscopy:  none BMD:   none TDaP: 2007 Labs: none Self breast exam:done monthly   reports that she quit smoking about 5 years ago. Her smoking use included Cigarettes. She has a 12.5 pack-year smoking history. She does not have any smokeless tobacco history on file. She reports that she drinks about 1.5 ounces of alcohol per week. She reports that she does not use illicit drugs.  Past Medical History  Diagnosis Date  . Thyroid disease     Hypothyroid  . STD (sexually transmitted disease)     HSV 2    Past Surgical History  Procedure Laterality Date  . Tubal ligation Bilateral 2001    Current Outpatient Prescriptions  Medication Sig Dispense Refill  . cetirizine (ZYRTEC) 10 MG tablet Take 10 mg by mouth daily.      Marland Kitchen ibuprofen (ADVIL,MOTRIN) 200 MG tablet Take 600 mg by mouth every 6 (six) hours as needed for moderate pain.      Marland Kitchen levothyroxine (SYNTHROID, LEVOTHROID) 75 MCG tablet Take 75 mcg by mouth daily before breakfast.       . omeprazole (PRILOSEC) 20 MG capsule Take 20 mg by mouth daily.       No current facility-administered medications for this visit.    Family History  Problem Relation Age of Onset  .  Parkinson's disease Mother   . Cancer Mother     bladder  . Hypertension Father   . Heart attack Father     ROS:  Pertinent items are noted in HPI.  Otherwise, a comprehensive ROS was negative.  Exam:   BP 122/80  Pulse 64  Resp 16  Ht 5' 7.25" (1.708 m)  Wt 209 lb (94.802 kg)  BMI 32.50 kg/m2  LMP 02/05/2014 Height: 5' 7.25" (170.8 cm)  Ht Readings from Last 3 Encounters:  02/14/14 5' 7.25" (1.708 m)  11/09/13 5\' 8"  (1.727 m)  09/17/13 5\' 7"  (1.702 m)    General appearance: alert, cooperative and appears stated age Head: Normocephalic, without obvious abnormality, atraumatic Neck: no adenopathy, supple, symmetrical, trachea midline and thyroid normal to inspection and palpation Lungs: clear to auscultation bilaterally Breasts: normal appearance, no masses or tenderness, No nipple retraction or dimpling, No nipple discharge or bleeding, No axillary or supraclavicular adenopathy Heart: regular rate and rhythm Abdomen: soft, non-tender; no masses,  no organomegaly Extremities: extremities normal, atraumatic, no cyanosis or edema Skin: Skin color, texture, turgor normal. No rashes or lesions Lymph nodes: Cervical, supraclavicular, and axillary nodes normal. No abnormal inguinal nodes palpated Neurologic: Grossly normal   Pelvic: External genitalia:  no lesions              Urethra:  normal appearing urethra with no masses, tenderness or  lesions              Bartholin's and Skene's: normal                 Vagina: normal appearing vagina with normal color and discharge, no lesions              Cervix: normal, no tenderness, or lesions              Pap taken: Yes.   Bimanual Exam:  Uterus:  normal size, contour, position, consistency, mobility, non-tender and anteverted              Adnexa: normal adnexa and no mass, fullness, tenderness               Rectovaginal: Confirms               Anus:  normal sphincter tone, no lesions  A:  Well Woman with normal exam  Hypothyroid  stable medication with PCP management.  Recent cystoscope due to hematuria, negative.  Increase in PMS with cycles.  HSV 2 no outbreaks declines Rx  P:   Reviewed health and wellness pertinent to exam  Continue follow up as indicated  Discussed Vitamin B complex for 2 weeks beginning midcycle, to help with PMS, decrease processed foods and increase water intake during that time. Will advise if needs assistance with.  Pap smear taken today with HPV reflex  Mammogram yearly  counseled on mammography screening, adequate intake of calcium and vitamin D, diet and exercise  return annually or prn  An After Visit Summary was printed and given to the patient.

## 2014-02-15 NOTE — Progress Notes (Signed)
Reviewed personally.  M. Suzanne Essance Gatti, MD.  

## 2014-02-19 LAB — IPS PAP TEST WITH REFLEX TO HPV

## 2014-04-14 ENCOUNTER — Encounter: Payer: Self-pay | Admitting: Certified Nurse Midwife

## 2014-06-13 HISTORY — PX: CHOLECYSTECTOMY: SHX55

## 2014-06-30 DIAGNOSIS — Z9049 Acquired absence of other specified parts of digestive tract: Secondary | ICD-10-CM | POA: Insufficient documentation

## 2014-09-09 ENCOUNTER — Telehealth: Payer: Self-pay | Admitting: Certified Nurse Midwife

## 2014-09-09 NOTE — Telephone Encounter (Addendum)
Spoke with patient. She states that her cycles are becoming increasing heavier and more painful. States that when cycle starts, in first two days, she rates her pain a 20/10. She states she discussed this at last annual exam with Regina Eck CNM and discussed adding B vitamins but this did not help, patient states she also discussed ablation with Regina Eck CNM. Patient feels that cycles have worsened in pain and bleeding and sometimes has large clots with cycle. LMP 09/04/14. Today, spotting only. Hx of tubal ligation. Patient requests evaluation before next cycle as she is concerned about pain. No current pain or heavy bleeding. She is leaving to go out of town tomorrow and will return next week. Scheduled office visit with Dr. Sabra Heck for 09/23/14 at 0830 for evaluation.  Patient states she had Gallbladder removed 06/30/14 and since she has had pain and tenderness in R upper chest that has not resolved. Patient had CT Abdomen and Pelvis done yesterday at a [facility] and was advised was negative. Requested patient obtain a copy of report and bring with her to appointment with Dr. Sabra Heck to review.  Patient agreeable to this plan and will call back if any concerns prior to her scheduled appointment. Routing to provider for final review. Patient agreeable to disposition. Will close encounter

## 2014-09-09 NOTE — Telephone Encounter (Signed)
Pt says she is having really bad menstrual cramps and would like an appointment.

## 2014-09-09 NOTE — Telephone Encounter (Signed)
Message left to return call to Kiwan [place] at [phone].    

## 2014-09-17 ENCOUNTER — Telehealth: Payer: Self-pay | Admitting: Obstetrics & Gynecology

## 2014-09-17 NOTE — Telephone Encounter (Signed)
Left patient a message cancelling her appointment for 09/23/14 with Dr. Sabra Heck for painful/heavy cycles. Need to move appointment to later in the morning on 09/23/14 after 10 AM.

## 2014-09-23 ENCOUNTER — Ambulatory Visit (INDEPENDENT_AMBULATORY_CARE_PROVIDER_SITE_OTHER): Payer: BC Managed Care – PPO

## 2014-09-23 ENCOUNTER — Encounter: Payer: Self-pay | Admitting: Obstetrics & Gynecology

## 2014-09-23 ENCOUNTER — Ambulatory Visit (INDEPENDENT_AMBULATORY_CARE_PROVIDER_SITE_OTHER): Payer: BC Managed Care – PPO | Admitting: Obstetrics & Gynecology

## 2014-09-23 ENCOUNTER — Ambulatory Visit: Payer: BC Managed Care – PPO | Admitting: Obstetrics & Gynecology

## 2014-09-23 VITALS — BP 132/78 | HR 56 | Resp 12 | Wt 207.6 lb

## 2014-09-23 DIAGNOSIS — N921 Excessive and frequent menstruation with irregular cycle: Secondary | ICD-10-CM

## 2014-09-23 DIAGNOSIS — D251 Intramural leiomyoma of uterus: Secondary | ICD-10-CM

## 2014-09-23 DIAGNOSIS — D250 Submucous leiomyoma of uterus: Secondary | ICD-10-CM

## 2014-09-23 NOTE — Progress Notes (Signed)
Pelvic ultrasound scheduled for today at 230 here in office.

## 2014-09-23 NOTE — Progress Notes (Signed)
Subjective:     Patient ID: Stefanie Brown, female   DOB: 1965/11/16, 49 y.o.   MRN: [id number]  HPI 49 yo G2P1 MWF here for discussion of worsening menstrual cycles.  Since September, pt reports flow has gotten heavier, she is having more clots, and she is having increased issues with pain.  Pt just had gall bladder removed and is still having RUQ pain as well.  She has seen her surgeon in [place] and is seeing a gastroenterologist and she really doesn't feel these are related.  Her bleeding issues are really just the first two days of her cycle as the rest of the cycle is more manageable.  As these are her two heaviest days, she really only has pain on these days as well.  She reports she used one of the medications she was given for post op pain during the worst day of her cycle last month and she said it didn't "touch" the pain at all.  Ready to proceed with whatever evaluation is needed.  Normal Pap obtained 02/14/14.  Review of Systems  All other systems reviewed and are negative.      Objective:   Physical Exam  Constitutional: She is oriented to person, place, and time. She appears well-developed and well-nourished.  Cardiovascular: Normal rate and regular rhythm.   Pulmonary/Chest: Effort normal and breath sounds normal.  Abdominal: Soft. Bowel sounds are normal. She exhibits no distension and no mass. There is no tenderness. There is no rebound and no guarding.  Cholecystectomy scars are healing well.  Genitourinary: Vagina normal. There is no rash, tenderness, lesion or injury on the right labia. There is no rash, tenderness, lesion or injury on the left labia. Uterus is enlarged (about 8-10 weeks size, normal shape). Cervix exhibits no motion tenderness. Right adnexum displays no mass and no tenderness. Left adnexum displays no mass and no tenderness. No tenderness in the vagina. No vaginal discharge found.  Lymphadenopathy:       Right: No inguinal adenopathy present.       Left: No  inguinal adenopathy present.  Neurological: She is alert and oriented to person, place, and time.  Psychiatric: She has a normal mood and affect.   D/W pt possible treatment options but need for proceeding with ultrasound as well.  Pt can return today so is scheduled for PUS this afternoon.  Will keep note open and complete after ultrasound.  FINDINGS: UTERUS: 8.3 x 6.0 x 6.2cm with multiple fibroids, largest 2.5cm.  At least 9 fibroids noted EMS: irregular in appreance, 12 mm, several submucosal fibroids noted ADNEXA:   Left ovary 3.0 x 1.9 x 1.8cm   Right ovary 2.4 x 1.6 x 1.4cm CUL DE SAC: no free fluid noted  D/W pt findings. Due to submucosal fibroid findings, IUD use is not appropriate.  Hysteroscopic fibroid resection with possible endometrial ablation discussed vs hysterectomy vs [place].  Procedures, risks and benefits discussed.  Possibility of decreased bleeding discussed as well as risk of recurrent bleeding/failure of procedure all discussed.  Pt REALLY does not want another surgery but really wants to stop the heavy bleeding and pain.  Information provided to her on above options.  She wants to consider options including time out of work and speak with HR at work and call back with decision.  All questions answered.     Assessment:     Menorrhagia with severe dysmenorrhea Uterine fibroids both submucosal and intramural     Plan:     Pt  will check with HR at work and consider options.  Stated she will call back with decision for surgery as she feels she will proceed with either the hysteroscopy or hysterectomy.  If decides on hysterectomy, plan pre-op endometrial biopsy.  ~40 minutes spent with patient >50% of time was in face to face discussion of above.  Pt was seen two separate times today and time is total of two separate visits.  First discussion including discussion of problems, evaluation, and possible treatment options.  Second time pt was seen, ultrasound images/findings  reviewed as well as discussion of options based on findings.

## 2014-09-25 ENCOUNTER — Encounter: Payer: Self-pay | Admitting: Obstetrics & Gynecology

## 2014-09-25 DIAGNOSIS — N921 Excessive and frequent menstruation with irregular cycle: Secondary | ICD-10-CM | POA: Insufficient documentation

## 2014-09-25 DIAGNOSIS — D251 Intramural leiomyoma of uterus: Secondary | ICD-10-CM | POA: Insufficient documentation

## 2014-09-25 DIAGNOSIS — D250 Submucous leiomyoma of uterus: Secondary | ICD-10-CM | POA: Insufficient documentation

## 2014-10-20 ENCOUNTER — Telehealth: Payer: Self-pay | Admitting: Obstetrics & Gynecology

## 2014-10-20 NOTE — Telephone Encounter (Signed)
Patient with several questions about surgical procedures and her options. Still really not ready to proceed with hysterectomy but is willing to schedule hysteroscopy/D&C for early June. Recovery time discussed. Will proceed with precert and call patient to schedule. She request to schedule after end of school year, 11-28-14. Patient also requests prescription for yeast between abdominal skin fold. Dr Sabra Heck looked at this when she was last here but this area has become worse with increased workouts and temperatures.

## 2014-10-20 NOTE — Telephone Encounter (Signed)
Patient saw Dr.Miller a few weeks ago and discussed some options for her. Patient has made a decision. Patient also has a rash that has gotten worse since her last visit 09/23/14.

## 2014-10-21 MED ORDER — NYSTATIN POWD: Status: DC

## 2014-10-21 NOTE — Telephone Encounter (Signed)
Rx for Nystatin powder signed.  Does she need another consultation?  OK to schedule if no and just plan pre-op.  Thanks.

## 2014-10-21 NOTE — Telephone Encounter (Signed)
Left message for patient to call back. Need to go over surgery coverage information.

## 2014-10-24 ENCOUNTER — Telehealth: Payer: Self-pay | Admitting: Obstetrics & Gynecology

## 2014-10-24 NOTE — Telephone Encounter (Signed)
Left message for patient to call back. Need to go over surgery benefits. °

## 2014-10-28 NOTE — Telephone Encounter (Signed)
Left message for patient to call back  

## 2014-10-30 NOTE — Telephone Encounter (Signed)
Spoke with patient. Advised of benefit quote received for the surgeon portion of her surgery. Advised that payment is due in full at least 3 weeks prior to the scheduled surgery date. Patient agreeable. Advised patient that she will receive separate communication from the hospital regarding facility charges/fees/payment.  Advised that she will be hearing from [facility] regarding scheduling

## 2014-10-31 NOTE — Telephone Encounter (Signed)
Spoke with patient. Consult appointment scheduled with Dr.Miller for June 7th at 3pm. Patient is agreeable to date and time.  Routing to provider for final review. Patient agreeable to disposition. Will close encounter.   Patient aware provider will review message and nurse will return call if any additional advice or change of disposition.

## 2014-10-31 NOTE — Telephone Encounter (Signed)
See next phone note regarding surgery scheduling information.

## 2014-10-31 NOTE — Telephone Encounter (Signed)
Call to patient. See previous phone message. Patient previously stated she was ready to schedule Hysteroscopy. Now thinks she wants hysterectomy. Still has several questions she needs to discuss with Dr Sabra Heck. Has appointment on 11-18-14. Interested in proceeding as soon as possible after consult. Possibly 12-01-14. Advised that this date is not available. Date options and scheduling policiy discussed. Patient will see me after consult with Dr Sabra Heck. No further scheduling until consult with Dr Sabra Heck.  Routing to provider for final review. Patient agreeable to disposition. Will close encounter.

## 2014-10-31 NOTE — Telephone Encounter (Signed)
Patient would like an appointment with Dr Sabra Heck to discuss surgery options again.

## 2014-11-07 ENCOUNTER — Telehealth: Payer: Self-pay | Admitting: Obstetrics & Gynecology

## 2014-11-07 NOTE — Telephone Encounter (Signed)
Left message for patient to call back. Need to go over hysterectomy benefit

## 2014-11-13 NOTE — Telephone Encounter (Signed)
Patient is scheduled for consult with Dr Sabra Heck on 11-18-14. Will wait until she makes decision to make further contact.  Routing to provider for final review. Patient agreeable to disposition. Will close encounter.

## 2014-11-18 ENCOUNTER — Telehealth: Payer: Self-pay | Admitting: Obstetrics & Gynecology

## 2014-11-18 ENCOUNTER — Institutional Professional Consult (permissible substitution): Payer: BC Managed Care – PPO | Admitting: Obstetrics & Gynecology

## 2014-11-18 NOTE — Telephone Encounter (Signed)
Patient missed her appointment with Dr. Sabra Heck for a pre-op consult today. I called her to reschedule and she is now scheduled for 12/08/14. 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 I am not allowed to make any calls. FYI only.

## 2014-11-18 NOTE — Telephone Encounter (Signed)
Ok to not apply any [facility] charges.

## 2014-11-19 NOTE — Telephone Encounter (Signed)
No fee applied.

## 2014-12-08 ENCOUNTER — Telehealth: Payer: Self-pay | Admitting: Obstetrics & Gynecology

## 2014-12-08 ENCOUNTER — Institutional Professional Consult (permissible substitution): Payer: BC Managed Care – PPO | Admitting: Obstetrics & Gynecology

## 2014-12-08 NOTE — Telephone Encounter (Signed)
Dr Sabra Heck, do I need to call this patient or let her call back. Please advise.//kn

## 2014-12-08 NOTE — Telephone Encounter (Signed)
Patient canceled her surgery consult appointment today due to her mother being ill. Patient will call later to reschedule.

## 2014-12-08 NOTE — Telephone Encounter (Signed)
I don't think we need to call her back right now.  She really didn't want surgery again this year.  Thanks.

## 2014-12-16 NOTE — Telephone Encounter (Signed)
Yes - thank you

## 2014-12-16 NOTE — Telephone Encounter (Signed)
Okay to close encounter?  °

## 2014-12-17 DIAGNOSIS — M519 Unspecified thoracic, thoracolumbar and lumbosacral intervertebral disc disorder: Secondary | ICD-10-CM | POA: Insufficient documentation

## 2015-01-28 ENCOUNTER — Telehealth: Payer: Self-pay | Admitting: Certified Nurse Midwife

## 2015-01-28 NOTE — Telephone Encounter (Signed)
Called patient to reschedule her AEX with Melvia Heaps, CNM due to provider day off. Rescheduled AEX to 03/19/15 with Melvia Heaps, CNM. During the call the patient said, "I am not sure I need an AEX. I may just need to see Dr. Sabra Heck for a consultation to schedule a hysterectomy for fibroids. I had an ultrasound but never did anything about it."

## 2015-01-28 NOTE — Telephone Encounter (Signed)
Left message to call Kaitlyn at [phone]. 

## 2015-01-30 NOTE — Telephone Encounter (Signed)
Spoke with patient. Patient states that she was seen with Dr.Miller and discussed surgery in April. Had to put surgery off due to her husband having cancer. Patient is interested in scheduling hysterectomy at this time. Was last seen in office on 09/23/2014 with Dr.Miller for PUS and discussion of treatment options. Advised will send a message to [place] and Lamont Snowball, RN to advise she would like to proceed with surgery scheduling at this time. Patient is agreeable and verbalizes understanding.  Cc: Lamont Snowball, RN

## 2015-02-02 NOTE — Telephone Encounter (Signed)
Returned call

## 2015-02-02 NOTE — Telephone Encounter (Signed)
Return call to patient. States she continues to have back pain,  pelvic pressure and pain into legs. Also has diarrhea about five days a week. States she looks pregnant. (uterus previous noted to be 8-10 week size in April) Anxious to do something that will help with pain. Still has concerns. Offered office visit to discuss with Dr Sabra Heck. 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  Routing to provider for final review. Patient agreeable to disposition. Will close encounter.

## 2015-02-02 NOTE — Telephone Encounter (Signed)
Return call to patient. Voice mail confirms name. Left message to call back. Last office visit 09-23-14 with Dr Sabra Heck. Discussed options at that time. Needed endometrial biopsy if decided to proceed with hysterectomy.

## 2015-02-03 ENCOUNTER — Encounter: Payer: Self-pay | Admitting: Obstetrics & Gynecology

## 2015-02-03 ENCOUNTER — Ambulatory Visit (INDEPENDENT_AMBULATORY_CARE_PROVIDER_SITE_OTHER): Payer: BC Managed Care – PPO | Admitting: Obstetrics & Gynecology

## 2015-02-03 VITALS — BP 148/90 | HR 64 | Resp 16 | Wt 207.0 lb

## 2015-02-03 DIAGNOSIS — R102 Pelvic and perineal pain unspecified side: Secondary | ICD-10-CM

## 2015-02-03 DIAGNOSIS — E038 Other specified hypothyroidism: Secondary | ICD-10-CM | POA: Diagnosis not present

## 2015-02-03 DIAGNOSIS — N920 Excessive and frequent menstruation with regular cycle: Secondary | ICD-10-CM

## 2015-02-03 DIAGNOSIS — R5383 Other fatigue: Secondary | ICD-10-CM | POA: Diagnosis not present

## 2015-02-03 NOTE — Progress Notes (Deleted)
Subjective:     Patient ID: Stefanie Brown, female   DOB: [birth year], 49 y.o.   MRN: [id number]  HPI   Reports having increased issues with loose stools in the morning.    Sister has been diagnosed with   Review of Systems     Objective:   Physical Exam     Assessment:     ***    Plan:     ***

## 2015-02-04 LAB — CBC WITH DIFFERENTIAL/PLATELET
Basophils Absolute: 0 10*3/uL (ref 0.0–0.1)
Basophils Relative: 0 % (ref 0–1)
EOS ABS: 0.1 10*3/uL (ref 0.0–0.7)
EOS PCT: 2 % (ref 0–5)
HCT: 33.5 % — ABNORMAL LOW (ref 36.0–46.0)
Hemoglobin: 10.4 g/dL — ABNORMAL LOW (ref 12.0–15.0)
LYMPHS ABS: 1.4 10*3/uL (ref 0.7–4.0)
Lymphocytes Relative: 19 % (ref 12–46)
MCH: 23 pg — AB (ref 26.0–34.0)
MCHC: 31 g/dL (ref 30.0–36.0)
MCV: 74.1 fL — AB (ref 78.0–100.0)
MONO ABS: 0.7 10*3/uL (ref 0.1–1.0)
MONOS PCT: 10 % (ref 3–12)
MPV: 8.9 fL (ref 8.6–12.4)
Neutro Abs: 5.1 10*3/uL (ref 1.7–7.7)
Neutrophils Relative %: 69 % (ref 43–77)
PLATELETS: 228 10*3/uL (ref 150–400)
RBC: 4.52 MIL/uL (ref 3.87–5.11)
RDW: 16.6 % — AB (ref 11.5–15.5)
WBC: 7.4 10*3/uL (ref 4.0–10.5)

## 2015-02-04 LAB — C-REACTIVE PROTEIN: CRP: 1.1 mg/dL — ABNORMAL HIGH (ref <0.60)

## 2015-02-04 LAB — TSH: TSH: 1.277 u[IU]/mL (ref 0.350–4.500)

## 2015-02-04 LAB — ANTI-DNA ANTIBODY, DOUBLE-STRANDED: ds DNA Ab: 1 IU/mL

## 2015-02-04 LAB — SEDIMENTATION RATE: SED RATE: 20 mm/h (ref 0–20)

## 2015-02-05 ENCOUNTER — Telehealth: Payer: Self-pay | Admitting: Obstetrics & Gynecology

## 2015-02-05 DIAGNOSIS — R5382 Chronic fatigue, unspecified: Secondary | ICD-10-CM

## 2015-02-05 DIAGNOSIS — R197 Diarrhea, unspecified: Secondary | ICD-10-CM

## 2015-02-05 DIAGNOSIS — R102 Pelvic and perineal pain: Secondary | ICD-10-CM

## 2015-02-05 DIAGNOSIS — R7982 Elevated C-reactive protein (CRP): Secondary | ICD-10-CM

## 2015-02-05 DIAGNOSIS — G8929 Other chronic pain: Secondary | ICD-10-CM

## 2015-02-05 NOTE — Telephone Encounter (Signed)
Contacted patient to review benefits for surgery. Left voicemail for patient to return call to review. Benefits based on previous surgery quote. Pending Dr Ammie Ferrier completion of office note with final surgery codes and diagnosis codes. Benefits in guarantor account. Routing to [place] for review.

## 2015-02-06 ENCOUNTER — Encounter: Payer: Self-pay | Admitting: Obstetrics & Gynecology

## 2015-02-06 NOTE — Progress Notes (Signed)
Patient ID: Stefanie Brown, female   DOB: July 29, 1965, 49 y.o.   MRN: [id number]  49 yo G2P1 MWF here for discussion of heavy menstrual cycles as well as increased body pain.  Pt reports having low back pain with pain that radiates down over her buttocks.  She has had a recent MRI per her report with local PCP in [place].  Was told there was some "arthritis" but this was "normal".  Bleeding hasn't really changed.  Flow lasts about 4 days and two of these days are heavy.  H/O anemia.  Reports most recent labs were normal.  I do not have any of this testing.  Biggest issue for her is pain.  Feels like she has placed over her body that hurt.  Not sure if this is related to uterus and fibroids or now.  Advised pt this would be atypical.  I do not want her to proceed with a hysterectomy with hopes that "pain" will improve when I cannot guarantee that.  When we discussed this, she became tearful.  States she feels like she is 49 years old and this just can't be normal.  Feels she has discussed this with PCp several times without really getting anywhere.  Also, reports having increased issues with loose stools in the morning.  Pt had gall bladder removed earlier this year but feels loose stool change has been just within the last two months.  Did see local GI and has "the tests" done on her stool.  Reports these were normal.  She does not know exactly what was tested.  Has not had a colonoscopy.    Pt reports she also got a speeding ticket on the way down here so is just particularly frustrated today.  Has changed her mind about hysterectomy.  Does want bleeding to stop and feels even if pain doesn't improve, she will feel better without all the heavy bleeding.  D/W I feel we need to evaluate her for other pain issues before surgery.  She is agreeable with this.  Also, will need records from PCP and GI as well.  May need colonoscopy before surgery.  Assessment:  Menorrhagia, no significant change over last 9  months Know hx of multiple small fibroids, including a submucosal fibroid Body pain  Plan:  Release of records from PCP and GI obtained today CRP, sed rate, DS DNA, CBC obtained.  Will proceed from here in regards to additional testing. Pt would like to proceed with scheduling hysterectomy the Monday before Thanksgiving if possible.  Will send information to scheduler for surgery.  ~25 minutes spent with patient >50% of time was in face to face discussion of above.

## 2015-02-09 ENCOUNTER — Ambulatory Visit: Payer: BC Managed Care – PPO | Admitting: Obstetrics and Gynecology

## 2015-02-12 NOTE — Telephone Encounter (Signed)
Second attempt to contact patient to review benefits for surgery. Left voicemail for patient to return call to review. Amounts noted in guarantor account.

## 2015-02-13 ENCOUNTER — Telehealth: Payer: Self-pay | Admitting: *Deleted

## 2015-02-13 NOTE — Telephone Encounter (Signed)
Patient notified of surgery scheduling policy and agreeable. Advised surgery is scheduled for 05-04-15 as requested. All remaining surgery appointments will be scheduled pending results of referrals and ultrasounds.  See result note for referral orders. Aware business office will contact her for referral appointments and insurance estimate for ultrasound and surgery.

## 2015-02-13 NOTE — Telephone Encounter (Signed)
Return call from patient. Notified of lab results and recommendations from Dr Sabra Heck. Patient agreeable to plan. Pelvic ultrasound and endometrial biopsy scheduled for 02-19-15 at 330. Patient instructed to take Motrin 800 mg one hour prior with food.  Will proceed with referral appointments and notify her once scheduled. Discussed surgery scheduling policy and patient date preference of 05-04-15. Patient agreeable unless medical condition changes. Patient and Dr Sabra Heck will determine if surgery should be canceled.  Patient is instructed to begin iron supplement and to avoid calcium rich foods at time of supplement.   Routing to provider for final review. Patient agreeable to disposition. Will close encounter.

## 2015-02-13 NOTE — Telephone Encounter (Signed)
Call to patient, left message to call back. Ask for triage nurse. 

## 2015-02-13 NOTE — Telephone Encounter (Signed)
-----   Message from Megan Salon, MD sent at 02/12/2015  4:19 PM EDT ----- Please call pt.  Labs showed her CRP to be elevated and sed rate at the very top end of normal.  Also her hemoglobin was 10.4 showing anemia.  She is planning a hysterectomy around Thanksgiving but I am concerned that this may not fix all of her pain issues.  I did get report from GI. 1.)  i would really like her to see a GI here for second opinion regarding her loose stools/diarrhea.  Dr. Collene Mares. 2.) I'd like to see if Dr. Estanislado Pandy would see her for consultation.  Records will need to be send. 3.) I'd like her to start iron (slow FE is fine) daily.  She needs repeat PUS (I've never done one here) and an endometrial biopsy when this is done due to the change in her anemia.    Thanks.

## 2015-02-13 NOTE — Telephone Encounter (Signed)
Call to listed work number. No one works there by that name.

## 2015-02-17 ENCOUNTER — Ambulatory Visit: Payer: BC Managed Care – PPO | Admitting: Certified Nurse Midwife

## 2015-02-17 NOTE — Telephone Encounter (Signed)
Called patient to review multiple referral items including: surgery benefit, PUS/emb benefit, referrals to gi and rheumatology.   Left voicemail for patient to return call.

## 2015-02-18 NOTE — Telephone Encounter (Signed)
She has an ultrasound scheduled for tomorrow.  So I will have her see you before she leaves tomorrow.  OK to close encounter.

## 2015-02-18 NOTE — Telephone Encounter (Signed)
Fourth attempt to call patient with surgery benefits. Second attempt to call patient regarding PUS/emb benefit, referrals to gi and rheumatology.  Left voicemail for patient to return call.

## 2015-02-18 NOTE — Telephone Encounter (Signed)
Spoke with patient. Reviewed all referral appointment information. Patient will call Dr Lorie Apley office to reschedule appointment. Patient aware rheumatology referral still pending review by Dr Arlean Hopping office.  Patient aware of benefit for PUS appointment. Patient understands and agreeable.  Reviewed surgery benefits. Patient agreeable and requests to pay deposit at PUS appointment to secure surgery date. Patient aware this could be altered pending upcoming referral appointments.   Ok to close.

## 2015-02-19 ENCOUNTER — Ambulatory Visit (INDEPENDENT_AMBULATORY_CARE_PROVIDER_SITE_OTHER): Payer: BC Managed Care – PPO

## 2015-02-19 ENCOUNTER — Ambulatory Visit (INDEPENDENT_AMBULATORY_CARE_PROVIDER_SITE_OTHER): Payer: BC Managed Care – PPO | Admitting: Obstetrics & Gynecology

## 2015-02-19 VITALS — BP 110/78 | HR 68 | Resp 16 | Wt 207.0 lb

## 2015-02-19 DIAGNOSIS — G8929 Other chronic pain: Secondary | ICD-10-CM

## 2015-02-19 DIAGNOSIS — N949 Unspecified condition associated with female genital organs and menstrual cycle: Secondary | ICD-10-CM | POA: Diagnosis not present

## 2015-02-19 DIAGNOSIS — R102 Pelvic and perineal pain: Principal | ICD-10-CM

## 2015-02-19 DIAGNOSIS — D250 Submucous leiomyoma of uterus: Secondary | ICD-10-CM

## 2015-02-19 DIAGNOSIS — D500 Iron deficiency anemia secondary to blood loss (chronic): Secondary | ICD-10-CM | POA: Diagnosis not present

## 2015-02-19 DIAGNOSIS — N921 Excessive and frequent menstruation with irregular cycle: Secondary | ICD-10-CM | POA: Diagnosis not present

## 2015-02-19 NOTE — Progress Notes (Signed)
49 y.o.Marriedfemale here for a pelvic ultrasound due to pelvic pain, anemia, menorrhagia.  H/O uterine fibroids.  Surgery is planned for November but I am wondering if there is something else that is going on with her due to the physical pain she is experiencing.  She is being scheduled with a rheumatologist.  Pt has not had diarrhea now for about 7 days.  Decided to separate how she takes her medications and isn't sure this is the solution but it seems to have helped.   Patient's last menstrual period was 02/07/2015.  Sexually active:  yes  Contraception: bilateral tubal ligation  FINDINGS: UTERUS:  9.7 x 5.2 x 5.0cm with 2.4cm and 1.3cm intramural fibroids and 2.2cmprobable submucosal fibroids. EMS: 5.22mm ADNEXA:   Left ovary 2.3 x 1.7 x 1.5cm   Right ovary 2.8 x 1.8 x 2.3cm CUL DE SAC: no free fluid  Endometrial biopsy recommended.  Discussed with patient.  Verbal and written consent obtained.  Physical exam performed.  Uterus is mobile and without firmness.    Procedure:  Speculum placed.  Cervix visualized and cleansed with betadine prep.  A single toothed tenaculum was applied to the anterior lip of the cervix.  Endometrial pipelle was advanced through the cervix into the endometrial cavity without difficulty.  Pipelle passed to 8.5cm.  Suction applied and pipelle removed with good tissue sample obtained.  Tenculum removed.  No bleeding noted.  Patient tolerated procedure well.  Assessment:  Menorrhagia, uterine fibroids, anemia  Plan: Endometrial biopsy results pending Pt does have GI referral  Rhematology referral pending Pt has not started iron.  Instruction provided.    ~15 minutes spent with patient >50% of time was in face to face discussion of above.

## 2015-02-20 ENCOUNTER — Encounter: Payer: Self-pay | Admitting: Obstetrics & Gynecology

## 2015-02-27 ENCOUNTER — Telehealth: Payer: Self-pay | Admitting: Emergency Medicine

## 2015-02-27 NOTE — Telephone Encounter (Signed)
Message left to return call to [place] at [phone].    

## 2015-02-27 NOTE — Telephone Encounter (Signed)
-----   Message from Michele Mcalpine, RN sent at 02/26/2015  3:16 PM EDT ----- Notes Recorded by Megan Salon, MD on 02/25/2015 at 7:25 AM Please inform pt that her biopsy was negative for abnormal cells. She should have started her iron. She is scheduled for hysterectomy in November. She is seeing GI and rheumatology before that time.

## 2015-02-27 NOTE — Telephone Encounter (Signed)
Spoke with patient personally.  Advised of negative pathology results.  Pt has not heard about rheumatology referral.  Routine to becky for update from Dr. Charlette Caffey office.

## 2015-02-27 NOTE — Telephone Encounter (Signed)
Returning a cal to Triage.

## 2015-03-02 NOTE — Telephone Encounter (Signed)
Contacted Dr Arlean Hopping office. They stated they requested labs for review. I faxed the requested labs on 02/18/15. State they did not receive. Re-faxed labs. Will follow up on this intermittently this week.

## 2015-03-02 NOTE — Telephone Encounter (Signed)
Please make sure this is handled before the end of the week.

## 2015-03-06 ENCOUNTER — Ambulatory Visit: Payer: BC Managed Care – PPO | Admitting: Obstetrics and Gynecology

## 2015-03-06 NOTE — Telephone Encounter (Signed)
Called to review referral information with patient. Left voicemail for return call. All information listed below. If patient needs to cancel/reschedule, please advise to call office where appointment is scheduled.   Dr [facility] @ [facility] [address], [place], [place] [address] [phone] 05/12/15 @ 815  (next available new patient appointment)  **scheduler recommended patient make frequent calls checking for cancellations**

## 2015-03-11 NOTE — Telephone Encounter (Signed)
Dr. Sabra Heck, please see message.  Patient scheduled for surgery 05/04/15. First available with Dr. Estanislado Pandy is 05/12/15.  Please advise if patient should keep appointments as is or refer to another provider?

## 2015-03-11 NOTE — Telephone Encounter (Signed)
Amy from Dr. Arlean Hopping office returned Stefanie Brown's call. She said, "Unfortunately, we do not have anything sooner to offer this patient." I asked if they had an appointment cancellation wait list and was told they do not.

## 2015-03-12 NOTE — Telephone Encounter (Signed)
Stefanie Brown from Dr. Arlean Hopping office called with a cancellation for 04/09/15 at [place] arrive at 0800 for [place] appointment.  Changed appointment to earlier appointment.  Message left to return call to [place] at [phone] to advise.

## 2015-03-17 NOTE — Telephone Encounter (Signed)
Call to patient. She states she was seen by Dr. Trudie Reed today.  Advised will cancel appointment for Dr. Estanislado Pandy. Will await results from appointment today with Dr. Trudie Reed. Patient agreeable. Will close encounter.

## 2015-03-19 ENCOUNTER — Ambulatory Visit: Payer: BC Managed Care – PPO | Admitting: Certified Nurse Midwife

## 2015-03-26 NOTE — Telephone Encounter (Signed)
Sent to [employer] by mistake.//kn

## 2015-03-26 NOTE — Telephone Encounter (Signed)
Can you call to see if can get records from Dr. Trudie Reed.  I haven't gotten any to date.

## 2015-03-31 NOTE — Telephone Encounter (Signed)
Call to Dr Trudie Reed office, # [phone], faxing office notes over today.//kn

## 2015-04-02 ENCOUNTER — Telehealth: Payer: Self-pay | Admitting: Obstetrics & Gynecology

## 2015-04-02 NOTE — Telephone Encounter (Signed)
Patient calling with questions regarding surgery.

## 2015-04-02 NOTE — Telephone Encounter (Signed)
Spoke with patient. Patient is scheduled to have surgery on 05/04/2015 for robotic assisted total hysterectomy with Dr.Miller. Patient states that recently she has become more and more fatigued. "I am getting out of breath and weak when I do small things like shave and make the bed. It has been getting worse lately. My husband even says at times I am breathing a lot harder." Patient is concerned about having surgery with feeling this weak and out of breath all the time. "Dr.Miller wanted me to see a Rheumatologist and I have. I go for follow up next week to get the results. Patient feels that she may need further follow up with her heart and lungs. Requesting recommendations from [place]. Advised I will speak with Dr.Miller regarding her symptoms and concerns and return call with further recommendations. Patient is agreeable.

## 2015-04-13 ENCOUNTER — Telehealth: Payer: Self-pay | Admitting: *Deleted

## 2015-04-13 DIAGNOSIS — D508 Other iron deficiency anemias: Secondary | ICD-10-CM | POA: Insufficient documentation

## 2015-04-13 NOTE — Telephone Encounter (Signed)
Call to patient review surgery instructions. Cell phone Voice mail confirms patient name but mail box is full and cannot leave message.

## 2015-04-17 NOTE — Telephone Encounter (Signed)
I haven't gotten the follow up notes from Dr. Trudie Reed about this pt.  Could you see if you can get them?  Thanks.  I did receive the first visit notes.  Thanks.

## 2015-04-17 NOTE — Telephone Encounter (Signed)
Call to patient to review surgery instructions.  Voice mail confirms patients name but unable to leave message since voice mail box is full and will not accept new messages.

## 2015-04-20 NOTE — Telephone Encounter (Signed)
Patient returns call. States she has been in professional development class for a week.  Advised I am calling to schedule pre and post op appointments for surgery. Patient states she was waiting to talk to Dr Sabra Heck about proceeding with surgery.  She is not sure she is ready. She called to review this information with Dr Sabra Heck and has not heard back. She reports she is having trouble with warm and hot sensations under her skin on her abdomen. Concerned about proceeding with surgery when she is already having problems with "infection" under skin. She has said all along she doesn't want to have surgery to end up still having pain. She states she is just not sure she can physically handle surgery. Advised that Dr Sabra Heck would want her to be comfortable proceeding with surgery. Based on what she is saying, she has reservations and it is understandable if she needs to wait. We are just calling to schedule needed appointments if she wanted to proceed.  Patient states she is deciding to cancel at this point.  States she will reconsider next summer.

## 2015-04-20 NOTE — Telephone Encounter (Signed)
Call to patient's emergency contact, Larene Beach. Advised him we have been unable to reach [facility] due to full voicemail box and asked to have patient call office. He states she often does not clear messages and he will have her call.

## 2015-04-20 NOTE — Telephone Encounter (Signed)
Left message for Stefanie Brown at [facility] office to obtain follow up notes.

## 2015-04-20 NOTE — Telephone Encounter (Signed)
Call to [place] notified case is canceled.  Routing to provider for final review.  Will close encounter.

## 2015-04-20 NOTE — Telephone Encounter (Signed)
Call to patient. Cell number confirms patient's name but continues to have full voice mail and unable to leave message. Call to work number. Work number is to [employer]" and they do not have an employee or patient by the name of Blimi.

## 2015-04-20 NOTE — Telephone Encounter (Signed)
Additional call message reviewed. Call back to patient to provide update. Left message to call back. Can now leave message on voicemail.

## 2015-04-20 NOTE — Telephone Encounter (Signed)
Patient returned call. Advised that we had been working to get records from her other providers. Have also attempted to reach her without success. Patient states she has been started on Protonix and Pepcid as well as Lexapro but none of this is helping. She states she wants to have surgery but doesn't feel she is physically strong enough. Advised that will provide status update to Dr Sabra Heck regarding canceled surgery.

## 2015-04-21 NOTE — Telephone Encounter (Signed)
Spoke with Dr.Hawkes office. OV notes from patient's most recent visit to be faxed to the office for Dr.Miller's review.  Routing to [place] as Juluis Rainier.

## 2015-04-22 NOTE — Telephone Encounter (Signed)
Thank you.  Ok to close encounter. 

## 2015-05-04 ENCOUNTER — Ambulatory Visit (HOSPITAL_COMMUNITY)
Admission: RE | Admit: 2015-05-04 | Payer: BC Managed Care – PPO | Source: Ambulatory Visit | Admitting: Obstetrics & Gynecology

## 2015-05-04 ENCOUNTER — Encounter (HOSPITAL_COMMUNITY): Admission: RE | Payer: Self-pay | Source: Ambulatory Visit

## 2015-05-04 SURGERY — ROBOTIC ASSISTED TOTAL HYSTERECTOMY WITH SALPINGECTOMY
Anesthesia: General

## 2015-05-28 ENCOUNTER — Encounter: Payer: Self-pay | Admitting: Obstetrics and Gynecology

## 2015-05-28 ENCOUNTER — Ambulatory Visit (INDEPENDENT_AMBULATORY_CARE_PROVIDER_SITE_OTHER): Payer: BC Managed Care – PPO | Admitting: Obstetrics and Gynecology

## 2015-05-28 VITALS — BP 134/86 | HR 80 | Resp 16 | Wt 203.0 lb

## 2015-05-28 DIAGNOSIS — K645 Perianal venous thrombosis: Secondary | ICD-10-CM

## 2015-05-28 MED ORDER — LIDOCAINE 5 % EX OINT: 1.0000 "application " | TOPICAL_OINTMENT | Freq: Three times a day (TID) | CUTANEOUS | Status: DC

## 2015-05-28 MED ORDER — HYDROCORTISONE 2.5 % RE CREA: TOPICAL_CREAM | RECTAL | Status: DC

## 2015-05-28 NOTE — Patient Instructions (Signed)

## 2015-05-28 NOTE — Progress Notes (Signed)
Patient ID: Stefanie Brown, female   DOB: [birth year], 49 y.o.   MRN: [id number] GYNECOLOGY  VISIT   HPI: 49 y.o.   Married  Caucasian  female   G2P1011 with Patient's last menstrual period was 05/25/2015.   here c/o hemorrhoid and heavy menstrual bleeding. The patient was going to have a hysterectomy for symptomatic fibroids in 11/16 with Dr Sabra Heck, will reschedule for the summer. Over the last year she has been week, SOB with minimal exertion. 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 She has a long term h/o hemorrhoids, normally tolerable. A couple of days ago her hemorrhoids flared, she is in so much pain. Hard to sit, hard to sleep. Normal BM's, burns with BM. Small amount of bleeding with BM. She has issues with diarrhea and constipation.   GYNECOLOGIC HISTORY: Patient's last menstrual period was 05/25/2015. Contraception:Tubal Ligation Menopausal hormone therapy: N/A        OB History    Gravida Para Term Preterm AB TAB SAB Ectopic Multiple Living   2 1 1  1  1   1          Patient Active Problem List   Diagnosis Date Noted  . 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 Intramural leiomyoma of uterus 09/25/2014  . Submucous leiomyoma of uterus 09/25/2014  . Menorrhagia with irregular cycle 09/25/2014  . History of cholecystectomy 06/30/2014  . 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 Lower esophageal ring 05/11/2010  . GERD 04/27/2010  . DYSPHAGIA 04/27/2010  . ABDOMINAL PAIN-EPIGASTRIC 04/27/2010  . 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 Asthma, intermittent 11/18/2009  . Adult hypothyroidism 08/05/2009  . Hypercholesteremia 08/08/2005    Past Medical History  Diagnosis Date  . Thyroid disease     Hypothyroid  . STD (sexually transmitted disease)     HSV 2    Past Surgical History  Procedure Laterality Date  . Tubal ligation Bilateral 2001  . Cholecystectomy      Current Outpatient Prescriptions  Medication Sig Dispense Refill  . cetirizine (ZYRTEC) 10  MG tablet Take 10 mg by mouth daily.    Marland Kitchen ibuprofen (ADVIL,MOTRIN) 200 MG tablet Take 600 mg by mouth every 6 (six) hours as needed for moderate pain.    Marland Kitchen levothyroxine (SYNTHROID, LEVOTHROID) 75 MCG tablet Take 75 mcg by mouth daily before breakfast.     . pantoprazole (PROTONIX) 40 MG tablet Take by mouth.     No current facility-administered medications for this visit.     ALLERGIES: Sulfamethoxazole-trimethoprim and Sulfonamide derivatives  Family History  Problem Relation Age of Onset  . Parkinson's disease Mother   . Cancer Mother     bladder, bladder removed  . Hypertension Father   . Heart attack Father     Social History   Social History  . Marital Status: Married    Spouse Name: N/A  . Number of Children: 1  . Years of Education: N/A   Occupational History  . Not on file.   Social History Main Topics  . Smoking status: Former Smoker -- 0.50 packs/day for 25 years    Types: Cigarettes    Quit date: 06/13/2008  . Smokeless tobacco: Never Used  . Alcohol Use: 1.8 oz/week    3 Standard drinks or equivalent per week  . Drug Use: No  . Sexual Activity:    Partners: Male    Birth Control/ Protection: Surgical     Comment: BTL   Other Topics Concern  . Not on file   Social  History Narrative    Review of Systems  Genitourinary:       Heavy menstrual bleeding Hemorrhoid   All other systems reviewed and are negative.   PHYSICAL EXAMINATION:    BP 134/86 mmHg  Pulse 80  Resp 16  Wt 203 lb (92.08 kg)  LMP 05/25/2015    General appearance: alert, cooperative and appears stated age  Anus: 1.5 cm external thrombosed hemorrhoid, very tender  Chaperone was present for exam.  ASSESSMENT Thrombosed external hemorrhoid, very tender    PLAN Will refer to Surgery Will treat with anusol and lidocaine ointment Sitz baths prn Ice prn    An After Visit Summary was printed and given to the patient.

## 2015-08-11 ENCOUNTER — Telehealth: Payer: Self-pay | Admitting: Obstetrics & Gynecology

## 2015-08-11 NOTE — Telephone Encounter (Signed)
Patient calls back. States she is "ready to do this." States she is in so much pain with her period that she has to proceed with surgery. Last night pain scale was 12. States it is only this bad with her period but that means two days ever month she feels this bad. LMP 08-09-15. Advised recommend office visit with Dr Sabra Heck to update exam and re-discuss options since last seen 02-2015. Last annual/pap 02-2014 with French Ana, CNM.   Appointment scheduled for 08-13-15 at 3pm with Dr Sabra Heck.  Routing to provider for final review. Anything else before appointment?

## 2015-08-11 NOTE — Telephone Encounter (Signed)
Patient wanting to talk with nurse to schedule Hysterectomy. Best # to reach: [phone]

## 2015-08-11 NOTE — Telephone Encounter (Signed)
Return call to patient. Voice mail confirms "Stefanie Brown." left message to call back, recommend office visit.

## 2015-08-11 NOTE — Telephone Encounter (Signed)
Patient returning your call.

## 2015-08-11 NOTE — Telephone Encounter (Signed)
Return call to patient. Left message to call back. Last seen by Dr Sabra Heck 02-19-15. Last seen by Dr Talbert Nan [phone] for thrombosed hemorrhoid.

## 2015-08-11 NOTE — Telephone Encounter (Signed)
Patient was scheduled for Robotic assisted total hysterectomy with salpingectomy for 05/04/2015. This was cancelled. The patient would like to reschedule at this time. Routing to Lamont Snowball, RN for review and scheduling.

## 2015-08-12 NOTE — Telephone Encounter (Signed)
No.  Appt is all she needs.  Thanks.

## 2015-08-12 NOTE — Telephone Encounter (Signed)
Encounter closed

## 2015-08-13 ENCOUNTER — Ambulatory Visit (INDEPENDENT_AMBULATORY_CARE_PROVIDER_SITE_OTHER): Payer: BC Managed Care – PPO | Admitting: Obstetrics & Gynecology

## 2015-08-13 ENCOUNTER — Telehealth: Payer: Self-pay | Admitting: *Deleted

## 2015-08-13 ENCOUNTER — Other Ambulatory Visit: Payer: Self-pay | Admitting: *Deleted

## 2015-08-13 ENCOUNTER — Encounter: Payer: Self-pay | Admitting: Obstetrics & Gynecology

## 2015-08-13 VITALS — BP 130/80 | HR 96 | Resp 16 | Ht 67.25 in | Wt 208.0 lb

## 2015-08-13 DIAGNOSIS — Z124 Encounter for screening for malignant neoplasm of cervix: Secondary | ICD-10-CM

## 2015-08-13 DIAGNOSIS — N921 Excessive and frequent menstruation with irregular cycle: Secondary | ICD-10-CM

## 2015-08-13 DIAGNOSIS — D508 Other iron deficiency anemias: Secondary | ICD-10-CM | POA: Diagnosis not present

## 2015-08-13 DIAGNOSIS — D250 Submucous leiomyoma of uterus: Secondary | ICD-10-CM | POA: Diagnosis not present

## 2015-08-13 DIAGNOSIS — D251 Intramural leiomyoma of uterus: Secondary | ICD-10-CM | POA: Diagnosis not present

## 2015-08-13 LAB — CBC
HEMATOCRIT: 34.4 % — AB (ref 36.0–46.0)
HEMOGLOBIN: 10.7 g/dL — AB (ref 12.0–15.0)
MCH: 23 pg — AB (ref 26.0–34.0)
MCHC: 31.1 g/dL (ref 30.0–36.0)
MCV: 74 fL — AB (ref 78.0–100.0)
MPV: 8.9 fL (ref 8.6–12.4)
Platelets: 267 10*3/uL (ref 150–400)
RBC: 4.65 MIL/uL (ref 3.87–5.11)
RDW: 17.9 % — AB (ref 11.5–15.5)
WBC: 8.2 10*3/uL (ref 4.0–10.5)

## 2015-08-13 MED ORDER — OXYCODONE-ACETAMINOPHEN 5-325 MG PO TABS: 2.0000 | ORAL_TABLET | ORAL | Status: DC | PRN

## 2015-08-13 NOTE — Progress Notes (Signed)
After consult with Dr Sabra Heck, patient desires to proceed with scheduling surgery. Surgery scheduled for Monday, August 31, 2015 at [place] at [facility]. Patient to arrive at 0600. Surgery instruction sheet reviewed and printed copy provided to patient.

## 2015-08-13 NOTE — Telephone Encounter (Signed)
Left without getting blood drawn. Needs to come back today or tomorrow.

## 2015-08-13 NOTE — Progress Notes (Signed)
50 y.o. G7P1011 Married Caucasian female here for discussion of possible surgery.  Pt and I discussed this last year but pt was not ready to proceed.  Reports cycles are longer and heavier and now she is passing clots.  Feels tired all of the time.  Having increased cramping/pain with cycles.  She just feels like her cycle and associated symptoms are taking over her life.  She has established care with a PCP, rheumatologist, and gastroenterologist as well.    During evaluation, last year, pt had an endometrial biopsy on 02/19/15 showing proliferative endometrium, anemia with hemoglobin ~10, and ultrasound showing 2.4 and 1.3cm intramural fibroids and 2.2cm probable submucosal fibroid.  Hysteroscopic resection of this fibroid was discussed.  Pt is completely tired of bleeding and wants to be done.  Procedure discussed with patient.  Hospital stay, recovery and pain management all discussed.  Risks discussed including but not limited to bleeding, 1% risk of receiving a  transfusion, infection, 3-4% risk of bowel/bladder/ureteral/vascular injury discussed as well as possible need for additional surgery if injury does occur discussed.  DVT/PE and rare risk of death discussed.  My actual complications with prior surgeries discussed.  Vaginal cuff dehiscence discussed.  Hernia formation discussed.  Positioning and incision locations discussed.  Patient aware if pathology abnormal she may need additional treatment.  All questions answered.    Ob Hx:   Patient's last menstrual period was 08/13/2015.          Sexually active: Yes.   Birth control: bilateral tubal ligation Last pap: will do today Last MMG: 4/15 Tobacco: no  Past Surgical History  Procedure Laterality Date  . Tubal ligation Bilateral 2001  . Cholecystectomy      Past Medical History  Diagnosis Date  . Thyroid disease     Hypothyroid  . STD (sexually transmitted disease)     HSV 2    Allergies: Sulfamethoxazole-trimethoprim and  Sulfonamide derivatives  Current Outpatient Prescriptions  Medication Sig Dispense Refill  . cetirizine (ZYRTEC) 10 MG tablet Take 10 mg by mouth daily.    Marland Kitchen levothyroxine (SYNTHROID, LEVOTHROID) 75 MCG tablet Take 75 mcg by mouth daily before breakfast.     . pantoprazole (PROTONIX) 40 MG tablet Take by mouth.     No current facility-administered medications for this visit.    ROS: fatigue, pelvic pain, heavy bleeding  Exam:    BP 130/80 mmHg  Pulse 96  Resp 16  Ht 5' 7.25" (1.708 m)  Wt 208 lb (94.348 kg)  BMI 32.34 kg/m2  LMP 08/13/2015  General appearance: alert and cooperative Head: Normocephalic, without obvious abnormality, atraumatic Neck: no adenopathy, supple, symmetrical, trachea midline and thyroid not enlarged, symmetric, no tenderness/mass/nodules Lungs: clear to auscultation bilaterally Heart: regular rate and rhythm, S1, S2 normal, no murmur, click, rub or gallop Abdomen: soft, non-tender; bowel sounds normal; no masses,  no organomegaly Extremities: extremities normal, atraumatic, no cyanosis or edema Skin: Skin color, texture, turgor normal. No rashes or lesions Lymph nodes: Cervical, supraclavicular, and axillary nodes normal. no inguinal nodes palpated Neurologic: Grossly normal  Pelvic: External genitalia:  no lesions              Urethra: normal appearing urethra with no masses, tenderness or lesions              Bartholins and Skenes: normal                 Vagina: normal appearing vagina with normal color and discharge, no lesions  Cervix: normal appearance              Pap taken: Yes.          Bimanual Exam:  Uterus:  enlarged to 8-10 week's size, mobile                                      Adnexa:    normal adnexa in size, nontender and no masses                                      Rectovaginal: Deferred                                      Anus:  normal sphincter tone, no lesions  A: Uterine Fibroids,  symptomatic Menorrhagia Pelvic pain in female GERD (can take anti-inflammatories but not long-term)    P:  TLH/bilateral salpingectomy/possible BSO/cystoscopy planned Pap obtained today CBC obtained as well to make sure pt's anemia isn't a lot worse. Rx for Motrin and Percocet given. Medications/Vitamins reviewed.  Pt knows needs to stop anything with aspirin in it at least five days before surgery. Hysterectomy brochure given for pre and post op instructions.  ~30 minutes spent with patient >50% of time was in face to face discussion of above.

## 2015-08-14 ENCOUNTER — Other Ambulatory Visit: Payer: Self-pay | Admitting: Obstetrics & Gynecology

## 2015-08-17 LAB — IPS PAP TEST WITH HPV

## 2015-08-18 ENCOUNTER — Telehealth: Payer: Self-pay | Admitting: *Deleted

## 2015-08-18 DIAGNOSIS — R87619 Unspecified abnormal cytological findings in specimens from cervix uteri: Secondary | ICD-10-CM

## 2015-08-18 NOTE — Telephone Encounter (Signed)
Notes Recorded by Elroy Channel, CMA on 08/18/2015 at 11:45 AM Pt notified. Verbalized understanding.

## 2015-08-18 NOTE — Telephone Encounter (Signed)
Call to patient, left message to call back.  AGUS Pap results reviewed with Dr Talbert Nan while Dr Sabra Heck out of office. Patient is scheduled for TLH on 08-31-15. Call to patient to schedule colpo/endo biospy for later this week.

## 2015-08-18 NOTE — Telephone Encounter (Signed)
Notes Recorded by Elroy Channel, CMA on 08/18/2015 at 9:24 AM LM for pt to call back. Notes Recorded by Megan Salon, MD on 08/14/2015 at 7:19 PM Please let pt know hb still low at 10.7 but not any worse then prior testing six months ago. This will not interfere with proceeding with surgery

## 2015-08-18 NOTE — Telephone Encounter (Signed)
Patient returned call. Notified of abnormal cells noted on pap smear and additional testing recommended. Brief explanation of result and colpo/possible endo biopsy. Instructed to take Motrin 800 mg one hour prior to procedure (still 10 days prior to surgery.)  Advised Dr Sabra Heck will review in detail at appointment. Appointment scheduled for 08-21-15 at 130 with Dr Sabra Heck. Patient declined appointment on 08-20-15.   Routing to provider for final review. Patient agreeable to disposition. Will close encounter.

## 2015-08-20 ENCOUNTER — Inpatient Hospital Stay (HOSPITAL_COMMUNITY): Admission: RE | Admit: 2015-08-20 | Payer: BC Managed Care – PPO | Source: Ambulatory Visit

## 2015-08-21 ENCOUNTER — Encounter: Payer: Self-pay | Admitting: Obstetrics & Gynecology

## 2015-08-21 ENCOUNTER — Telehealth: Payer: Self-pay | Admitting: Obstetrics & Gynecology

## 2015-08-21 ENCOUNTER — Ambulatory Visit (INDEPENDENT_AMBULATORY_CARE_PROVIDER_SITE_OTHER): Payer: BC Managed Care – PPO | Admitting: Obstetrics & Gynecology

## 2015-08-21 ENCOUNTER — Encounter (HOSPITAL_COMMUNITY)
Admission: RE | Admit: 2015-08-21 | Discharge: 2015-08-21 | Disposition: A | Discharge status: Home / Self Care | Payer: BC Managed Care – PPO | Source: Ambulatory Visit | Attending: Obstetrics & Gynecology | Admitting: Obstetrics & Gynecology

## 2015-08-21 ENCOUNTER — Encounter (HOSPITAL_COMMUNITY): Payer: Self-pay

## 2015-08-21 DIAGNOSIS — R87619 Unspecified abnormal cytological findings in specimens from cervix uteri: Secondary | ICD-10-CM

## 2015-08-21 DIAGNOSIS — D500 Iron deficiency anemia secondary to blood loss (chronic): Secondary | ICD-10-CM | POA: Insufficient documentation

## 2015-08-21 DIAGNOSIS — R109 Unspecified abdominal pain: Secondary | ICD-10-CM | POA: Insufficient documentation

## 2015-08-21 DIAGNOSIS — N920 Excessive and frequent menstruation with regular cycle: Secondary | ICD-10-CM | POA: Diagnosis not present

## 2015-08-21 DIAGNOSIS — Z01812 Encounter for preprocedural laboratory examination: Secondary | ICD-10-CM | POA: Diagnosis present

## 2015-08-21 DIAGNOSIS — D259 Leiomyoma of uterus, unspecified: Secondary | ICD-10-CM | POA: Diagnosis not present

## 2015-08-21 HISTORY — DX: Hypothyroidism, unspecified: E03.9

## 2015-08-21 HISTORY — DX: Personal history of other diseases of the digestive system: Z87.19

## 2015-08-21 HISTORY — DX: Depression, unspecified: F32.A

## 2015-08-21 HISTORY — DX: Anxiety disorder, unspecified: F41.9

## 2015-08-21 HISTORY — DX: Gastro-esophageal reflux disease without esophagitis: K21.9

## 2015-08-21 HISTORY — DX: Major depressive disorder, single episode, unspecified: F32.9

## 2015-08-21 LAB — CBC
HEMATOCRIT: 35.7 % — AB (ref 36.0–46.0)
HEMOGLOBIN: 11.3 g/dL — AB (ref 12.0–15.0)
MCH: 23.7 pg — ABNORMAL LOW (ref 26.0–34.0)
MCHC: 31.7 g/dL (ref 30.0–36.0)
MCV: 75 fL — AB (ref 78.0–100.0)
Platelets: 252 10*3/uL (ref 150–400)
RBC: 4.76 MIL/uL (ref 3.87–5.11)
RDW: 17.1 % — ABNORMAL HIGH (ref 11.5–15.5)
WBC: 8.6 10*3/uL (ref 4.0–10.5)

## 2015-08-21 LAB — TYPE AND SCREEN
ABO/RH(D): A POS
Antibody Screen: NEGATIVE

## 2015-08-21 LAB — ABO/RH: ABO/RH(D): A POS

## 2015-08-21 NOTE — Telephone Encounter (Signed)
Called patient to review benefits for a recommended procedure. Left Voicemail requesting a call back. °

## 2015-08-21 NOTE — Progress Notes (Signed)
Patient ID: Stefanie Brown, female   DOB: 1965/07/01, 50 y.o.   MRN: [id number]  Chief Complaint  Patient presents with  . Procedure    Colposcopy //RB    HPI  Stefanie Brown is a 50 y.o. female G2P1 MWF here for colposcopy, endometrial biopsy and ECC due to AGUS pap smear obtained 08/12/15.  Pt is scheduled for surgery on 08/31/15.    Past Medical History  Diagnosis Date  . Thyroid disease     Hypothyroid  . STD (sexually transmitted disease)     HSV 2    Past Surgical History  Procedure Laterality Date  . Tubal ligation Bilateral 2001  . Cholecystectomy      Family History  Problem Relation Age of Onset  . Parkinson's disease Mother   . Cancer Mother     bladder, bladder removed  . Hypertension Father   . Heart attack Father     Social History Social History  Substance Use Topics  . Smoking status: Former Smoker -- 0.50 packs/day for 25 years    Types: Cigarettes    Quit date: 06/13/2008  . Smokeless tobacco: Never Used  . Alcohol Use: 1.8 oz/week    3 Standard drinks or equivalent per week    Allergies  Allergen Reactions  . Sulfamethoxazole-Trimethoprim Other (See Comments)    Childhood allergy  . Sulfonamide Derivatives Other (See Comments)    Childhood allergy    Current Outpatient Prescriptions  Medication Sig Dispense Refill  . cetirizine (ZYRTEC) 10 MG tablet Take 10 mg by mouth daily.    Marland Kitchen levothyroxine (SYNTHROID, LEVOTHROID) 75 MCG tablet Take 75 mcg by mouth daily before breakfast.     . pantoprazole (PROTONIX) 40 MG tablet Take by mouth.    . escitalopram (LEXAPRO) 5 MG tablet Take 5 mg by mouth daily. Reported on 08/21/2015  5  . oxyCODONE-acetaminophen (PERCOCET) 5-325 MG tablet Take 2 tablets by mouth every 4 (four) hours as needed. use only as much as needed to relieve pain (Patient not taking: Reported on 08/21/2015) 30 tablet 0   No current facility-administered medications for this visit.    Review of Systems Review of Systems  All  other systems reviewed and are negative.   Blood pressure 140/80, pulse 102, resp. rate 16, height 5' 7.25" (1.708 m), weight 208 lb (94.348 kg), last menstrual period 08/13/2015.  Physical Exam Physical Exam  Constitutional: She appears well-developed and well-nourished.  Genitourinary: Vagina normal and uterus normal. There is no rash, tenderness, lesion or injury on the right labia. There is no rash, tenderness, lesion or injury on the left labia. Right adnexum displays no mass, no tenderness and no fullness. Left adnexum displays no mass, no tenderness and no fullness.  Lymphadenopathy:       Right: No inguinal adenopathy present.       Left: No inguinal adenopathy present.   Procedure Details  Speculum placed.  Acetic acid applied for >45 seconds.  Cervix visualized with 7.5 X and 15x magnification.  No AWE was noted.  Lugol's applied.  No abnormal staining noted.  No biopsies obtained.  ECC obtained.  Cervix visualized and cleansed with betadine prep.  A single toothed tenaculum was applied to the anterior lip of the cervix.  Endometrial pipelle was advanced through the cervix into the endometrial cavity without difficulty.  Pipelle passed to 9cm.  Suction applied and pipelle removed with good tissue sample obtained.  Tenculum removed.  No bleeding noted.  Patient tolerated procedure well.  Specimens:  endometrial biopsy, ECC  Complications: none.  Assessment: AGUS pap smear  Plan Pathology will be called to pt.  As long as this is benign, she will proceed with surgery as scheduled.     [name] 08/21/2015, 2:10 PM

## 2015-08-21 NOTE — Patient Instructions (Signed)
Your procedure is scheduled on: Monday August 31, 2015   Enter through the Main Entrance of [facility] at: 6:00 am   Pick up the phone at the desk and dial [phone].  Call this number if you have problems the morning of surgery: [phone].  Remember: Do NOT eat food: after midnight on Sunday  Do NOT drink clear liquids after: midnight on Sunday  Take these medicines the morning of surgery with a SIP OF WATER: Levothyroxine, Zyrtec and protonix   Do NOT wear jewelry (body piercing), metal hair clips/bobby pins, make-up, or nail polish. Do NOT wear lotions, powders, or perfumes.  You may wear deoderant. Do NOT shave for 48 hours prior to surgery. Do NOT bring valuables to the hospital. Contacts, dentures, or bridgework may not be worn into surgery. Leave suitcase in car.  After surgery it may be brought to your room.  For patients admitted to the hospital, checkout time is 11:00 AM the day of discharge.

## 2015-08-21 NOTE — Patient Instructions (Signed)

## 2015-08-25 LAB — IPS OTHER TISSUE BIOPSY

## 2015-08-26 ENCOUNTER — Telehealth: Payer: Self-pay | Admitting: Emergency Medicine

## 2015-08-26 NOTE — Telephone Encounter (Signed)
Message left to return call to Rameen Quinney at [phone].    

## 2015-08-26 NOTE — Telephone Encounter (Signed)
-----   Message from Megan Salon, MD sent at 08/25/2015  3:52 PM EDT ----- Please let pt know her endometrial biopsy and ecc were negative for abnormal cells.  We are fine to proceed with surgery as planned next week.  No additional testing is needed.  CC:  Emelia Salisbury

## 2015-08-26 NOTE — Telephone Encounter (Signed)
Patient returned call and she is given message from Dr. Sabra Heck. Verbalized understanding of results and will follow up as scheduled.  Routing to provider for final review. Patient agreeable to disposition. Will close encounter.

## 2015-08-30 MED ORDER — CEFOTETAN DISODIUM 2 G IJ SOLR
2.0000 g | INTRAMUSCULAR | Status: AC
  Administered 2015-08-31: 2 g via INTRAVENOUS
  Filled 2015-08-30: qty 2

## 2015-08-30 NOTE — Anesthesia Preprocedure Evaluation (Addendum)
Anesthesia Evaluation  Patient identified by MRN, date of birth, ID band Patient awake    Reviewed: Allergy & Precautions, NPO status , Patient's Chart, lab work & pertinent test results  History of Anesthesia Complications Negative for: history of anesthetic complications  Airway Mallampati: III  TM Distance: >3 FB Neck ROM: Full    Dental no notable dental hx. (+) Dental Advisory Given   Pulmonary asthma , former smoker,    Pulmonary exam normal breath sounds clear to auscultation       Cardiovascular negative cardio ROS Normal cardiovascular exam Rhythm:Regular Rate:Normal     Neuro/Psych PSYCHIATRIC DISORDERS Anxiety Depression negative neurological ROS     GI/Hepatic Neg liver ROS, GERD  Medicated and Controlled,  Endo/Other  Hypothyroidism obesity  Renal/GU negative Renal ROS  negative genitourinary   Musculoskeletal negative musculoskeletal ROS (+)   Abdominal   Peds negative pediatric ROS (+)  Hematology negative hematology ROS (+)   Anesthesia Other Findings   Reproductive/Obstetrics negative OB ROS                            Anesthesia Physical Anesthesia Plan  ASA: II  Anesthesia Plan: General   Post-op Pain Management:    Induction: Intravenous  Airway Management Planned: Oral ETT  Additional Equipment:   Intra-op Plan:   Post-operative Plan: Extubation in OR  Informed Consent: I have reviewed the patients History and Physical, chart, labs and discussed the procedure including the risks, benefits and alternatives for the proposed anesthesia with the patient or authorized representative who has indicated his/her understanding and acceptance.   Dental advisory given  Plan Discussed with: CRNA  Anesthesia Plan Comments:         Anesthesia Quick Evaluation

## 2015-08-31 ENCOUNTER — Ambulatory Visit (HOSPITAL_COMMUNITY): Payer: BC Managed Care – PPO | Admitting: Anesthesiology

## 2015-08-31 ENCOUNTER — Encounter (HOSPITAL_COMMUNITY)
Admission: RE | Disposition: A | Discharge status: Home / Self Care | Payer: Self-pay | Source: Ambulatory Visit | Attending: Obstetrics & Gynecology

## 2015-08-31 ENCOUNTER — Ambulatory Visit (HOSPITAL_COMMUNITY)
Admission: RE | Admit: 2015-08-31 | Discharge: 2015-09-01 | Disposition: A | Discharge status: Home / Self Care | Payer: BC Managed Care – PPO | Source: Ambulatory Visit | Attending: Obstetrics & Gynecology | Admitting: Obstetrics & Gynecology

## 2015-08-31 ENCOUNTER — Encounter (HOSPITAL_COMMUNITY): Payer: Self-pay | Admitting: *Deleted

## 2015-08-31 DIAGNOSIS — G8929 Other chronic pain: Secondary | ICD-10-CM | POA: Diagnosis not present

## 2015-08-31 DIAGNOSIS — E039 Hypothyroidism, unspecified: Secondary | ICD-10-CM | POA: Diagnosis not present

## 2015-08-31 DIAGNOSIS — Z87891 Personal history of nicotine dependence: Secondary | ICD-10-CM | POA: Diagnosis not present

## 2015-08-31 DIAGNOSIS — N920 Excessive and frequent menstruation with regular cycle: Secondary | ICD-10-CM | POA: Diagnosis not present

## 2015-08-31 DIAGNOSIS — K219 Gastro-esophageal reflux disease without esophagitis: Secondary | ICD-10-CM | POA: Insufficient documentation

## 2015-08-31 DIAGNOSIS — F418 Other specified anxiety disorders: Secondary | ICD-10-CM | POA: Diagnosis not present

## 2015-08-31 DIAGNOSIS — D509 Iron deficiency anemia, unspecified: Secondary | ICD-10-CM | POA: Diagnosis present

## 2015-08-31 DIAGNOSIS — D250 Submucous leiomyoma of uterus: Secondary | ICD-10-CM

## 2015-08-31 DIAGNOSIS — D649 Anemia, unspecified: Secondary | ICD-10-CM | POA: Insufficient documentation

## 2015-08-31 DIAGNOSIS — E669 Obesity, unspecified: Secondary | ICD-10-CM | POA: Diagnosis not present

## 2015-08-31 DIAGNOSIS — N949 Unspecified condition associated with female genital organs and menstrual cycle: Secondary | ICD-10-CM | POA: Diagnosis not present

## 2015-08-31 DIAGNOSIS — M545 Low back pain: Secondary | ICD-10-CM | POA: Diagnosis not present

## 2015-08-31 DIAGNOSIS — N921 Excessive and frequent menstruation with irregular cycle: Secondary | ICD-10-CM | POA: Diagnosis not present

## 2015-08-31 DIAGNOSIS — Z6832 Body mass index (BMI) 32.0-32.9, adult: Secondary | ICD-10-CM | POA: Insufficient documentation

## 2015-08-31 DIAGNOSIS — D252 Subserosal leiomyoma of uterus: Secondary | ICD-10-CM | POA: Insufficient documentation

## 2015-08-31 DIAGNOSIS — D500 Iron deficiency anemia secondary to blood loss (chronic): Secondary | ICD-10-CM | POA: Diagnosis not present

## 2015-08-31 HISTORY — PX: CYSTO: SHX6284

## 2015-08-31 HISTORY — PX: LAPAROSCOPIC HYSTERECTOMY: SHX1926

## 2015-08-31 HISTORY — PX: BILATERAL SALPINGECTOMY: SHX5743

## 2015-08-31 LAB — PREGNANCY, URINE: PREG TEST UR: NEGATIVE

## 2015-08-31 SURGERY — HYSTERECTOMY, TOTAL, LAPAROSCOPIC
Anesthesia: General

## 2015-08-31 MED ORDER — MIDAZOLAM HCL 2 MG/2ML IJ SOLN
INTRAMUSCULAR | Status: AC
  Filled 2015-08-31: qty 2

## 2015-08-31 MED ORDER — SIMETHICONE 80 MG PO CHEW: 80.0000 mg | CHEWABLE_TABLET | Freq: Four times a day (QID) | ORAL | Status: DC | PRN

## 2015-08-31 MED ORDER — HYDROMORPHONE HCL 1 MG/ML IJ SOLN
INTRAMUSCULAR | Status: AC
  Filled 2015-08-31: qty 1

## 2015-08-31 MED ORDER — ONDANSETRON HCL 4 MG/2ML IJ SOLN
INTRAMUSCULAR | Status: DC | PRN
  Administered 2015-08-31: 4 mg via INTRAVENOUS

## 2015-08-31 MED ORDER — ROCURONIUM BROMIDE 100 MG/10ML IV SOLN
INTRAVENOUS | Status: DC | PRN
  Administered 2015-08-31: 5 mg via INTRAVENOUS
  Administered 2015-08-31: 45 mg via INTRAVENOUS
  Administered 2015-08-31: 20 mg via INTRAVENOUS
  Administered 2015-08-31 (×2): 10 mg via INTRAVENOUS
  Administered 2015-08-31: 5 mg via INTRAVENOUS

## 2015-08-31 MED ORDER — ONDANSETRON HCL 4 MG/2ML IJ SOLN
INTRAMUSCULAR | Status: AC
  Filled 2015-08-31: qty 2

## 2015-08-31 MED ORDER — SUGAMMADEX SODIUM 200 MG/2ML IV SOLN
INTRAVENOUS | Status: DC | PRN
  Administered 2015-08-31: 378 mg via INTRAVENOUS

## 2015-08-31 MED ORDER — MIDAZOLAM HCL 5 MG/5ML IJ SOLN
INTRAMUSCULAR | Status: DC | PRN
  Administered 2015-08-31: 2 mg via INTRAVENOUS

## 2015-08-31 MED ORDER — DEXTROSE-NACL 5-0.45 % IV SOLN
INTRAVENOUS | Status: DC
  Administered 2015-08-31 (×2): via INTRAVENOUS

## 2015-08-31 MED ORDER — PANTOPRAZOLE SODIUM 40 MG IV SOLR
40.0000 mg | Freq: Every day | INTRAVENOUS | Status: DC
  Administered 2015-08-31: 40 mg via INTRAVENOUS
  Filled 2015-08-31: qty 40

## 2015-08-31 MED ORDER — PROPOFOL 10 MG/ML IV BOLUS
INTRAVENOUS | Status: AC
  Filled 2015-08-31: qty 20

## 2015-08-31 MED ORDER — SCOPOLAMINE 1 MG/3DAYS TD PT72
MEDICATED_PATCH | TRANSDERMAL | Status: AC
  Administered 2015-08-31: 1.5 mg via TRANSDERMAL
  Filled 2015-08-31: qty 1

## 2015-08-31 MED ORDER — HYDROMORPHONE HCL 1 MG/ML IJ SOLN
0.2500 mg | INTRAMUSCULAR | Status: DC | PRN
  Administered 2015-08-31: 0.25 mg via INTRAVENOUS

## 2015-08-31 MED ORDER — SCOPOLAMINE 1 MG/3DAYS TD PT72
1.0000 | MEDICATED_PATCH | Freq: Once | TRANSDERMAL | Status: DC
  Administered 2015-08-31: 1.5 mg via TRANSDERMAL

## 2015-08-31 MED ORDER — LIDOCAINE HCL (CARDIAC) 20 MG/ML IV SOLN
INTRAVENOUS | Status: DC | PRN
  Administered 2015-08-31 (×2): 50 mg via INTRAVENOUS

## 2015-08-31 MED ORDER — LACTATED RINGERS IV SOLN
INTRAVENOUS | Status: DC
  Administered 2015-08-31 (×3): via INTRAVENOUS

## 2015-08-31 MED ORDER — SODIUM CHLORIDE 0.9 % IV SOLN
INTRAVENOUS | Status: DC | PRN
  Administered 2015-08-31: 120 mL

## 2015-08-31 MED ORDER — MENTHOL 3 MG MT LOZG: 1.0000 | LOZENGE | OROMUCOSAL | Status: DC | PRN

## 2015-08-31 MED ORDER — ALUM & MAG HYDROXIDE-SIMETH 200-200-20 MG/5ML PO SUSP: 30.0000 mL | ORAL | Status: DC | PRN

## 2015-08-31 MED ORDER — PROPOFOL 500 MG/50ML IV EMUL
INTRAVENOUS | Status: DC | PRN
  Administered 2015-08-31: 20 mL via INTRAVENOUS

## 2015-08-31 MED ORDER — DEXAMETHASONE SODIUM PHOSPHATE 4 MG/ML IJ SOLN
INTRAMUSCULAR | Status: DC | PRN
  Administered 2015-08-31: 4 mg via INTRAVENOUS

## 2015-08-31 MED ORDER — SUCCINYLCHOLINE CHLORIDE 20 MG/ML IJ SOLN
INTRAMUSCULAR | Status: AC
  Filled 2015-08-31: qty 1

## 2015-08-31 MED ORDER — SUGAMMADEX SODIUM 500 MG/5ML IV SOLN
INTRAVENOUS | Status: AC
  Filled 2015-08-31: qty 5

## 2015-08-31 MED ORDER — DEXAMETHASONE SODIUM PHOSPHATE 4 MG/ML IJ SOLN
INTRAMUSCULAR | Status: AC
  Filled 2015-08-31: qty 1

## 2015-08-31 MED ORDER — LIDOCAINE HCL (CARDIAC) 20 MG/ML IV SOLN
INTRAVENOUS | Status: AC
  Filled 2015-08-31: qty 5

## 2015-08-31 MED ORDER — LEVOTHYROXINE SODIUM 75 MCG PO TABS
75.0000 ug | ORAL_TABLET | Freq: Every day | ORAL | Status: DC
  Administered 2015-09-01: 75 ug via ORAL
  Filled 2015-08-31 (×2): qty 1

## 2015-08-31 MED ORDER — OXYCODONE-ACETAMINOPHEN 5-325 MG PO TABS
1.0000 | ORAL_TABLET | ORAL | Status: DC | PRN
  Administered 2015-08-31 (×2): 2 via ORAL
  Administered 2015-09-01 (×2): 1 via ORAL
  Administered 2015-09-01: 2 via ORAL
  Filled 2015-08-31 (×3): qty 2
  Filled 2015-08-31 (×2): qty 1

## 2015-08-31 MED ORDER — FENTANYL CITRATE (PF) 100 MCG/2ML IJ SOLN
INTRAMUSCULAR | Status: DC | PRN
  Administered 2015-08-31: 25 ug via INTRAVENOUS
  Administered 2015-08-31: 100 ug via INTRAVENOUS
  Administered 2015-08-31 (×2): 50 ug via INTRAVENOUS
  Administered 2015-08-31: 100 ug via INTRAVENOUS
  Administered 2015-08-31: 25 ug via INTRAVENOUS

## 2015-08-31 MED ORDER — KETOROLAC TROMETHAMINE 30 MG/ML IJ SOLN: 30.0000 mg | Freq: Four times a day (QID) | INTRAMUSCULAR | Status: DC

## 2015-08-31 MED ORDER — FENTANYL CITRATE (PF) 100 MCG/2ML IJ SOLN
INTRAMUSCULAR | Status: AC
  Filled 2015-08-31: qty 2

## 2015-08-31 MED ORDER — KETOROLAC TROMETHAMINE 30 MG/ML IJ SOLN
30.0000 mg | Freq: Four times a day (QID) | INTRAMUSCULAR | Status: DC
  Administered 2015-08-31 – 2015-09-01 (×4): 30 mg via INTRAVENOUS
  Filled 2015-08-31 (×4): qty 1

## 2015-08-31 MED ORDER — LACTATED RINGERS IR SOLN
Status: DC | PRN
  Administered 2015-08-31: 3000 mL

## 2015-08-31 MED ORDER — SUCCINYLCHOLINE CHLORIDE 20 MG/ML IJ SOLN
INTRAMUSCULAR | Status: DC | PRN
  Administered 2015-08-31: 100 mg via INTRAVENOUS

## 2015-08-31 MED ORDER — ROPIVACAINE HCL 5 MG/ML IJ SOLN
INTRAMUSCULAR | Status: AC
  Filled 2015-08-31: qty 30

## 2015-08-31 MED ORDER — FENTANYL CITRATE (PF) 250 MCG/5ML IJ SOLN
INTRAMUSCULAR | Status: AC
  Filled 2015-08-31: qty 5

## 2015-08-31 MED ORDER — ROCURONIUM BROMIDE 100 MG/10ML IV SOLN
INTRAVENOUS | Status: AC
  Filled 2015-08-31: qty 1

## 2015-08-31 MED ORDER — STERILE WATER FOR IRRIGATION IR SOLN
Status: DC | PRN
  Administered 2015-08-31: 1000 mL

## 2015-08-31 MED ORDER — SODIUM CHLORIDE 0.9 % IJ SOLN
INTRAMUSCULAR | Status: AC
  Filled 2015-08-31: qty 30

## 2015-08-31 MED ORDER — LIDOCAINE-EPINEPHRINE 1 %-1:100000 IJ SOLN
INTRAMUSCULAR | Status: AC
  Filled 2015-08-31: qty 1

## 2015-08-31 MED ORDER — ACETAMINOPHEN 325 MG PO TABS: 650.0000 mg | ORAL_TABLET | ORAL | Status: DC | PRN

## 2015-08-31 MED ORDER — PROMETHAZINE HCL 25 MG/ML IJ SOLN: 6.2500 mg | INTRAMUSCULAR | Status: DC | PRN

## 2015-08-31 MED ORDER — HYDROMORPHONE HCL 1 MG/ML IJ SOLN
INTRAMUSCULAR | Status: AC
  Administered 2015-08-31: 0.25 mg via INTRAVENOUS
  Filled 2015-08-31: qty 1

## 2015-08-31 MED ORDER — MORPHINE SULFATE (PF) 4 MG/ML IV SOLN: 2.0000 mg | INTRAVENOUS | Status: DC | PRN

## 2015-08-31 SURGICAL SUPPLY — 55 items
BENZOIN TINCTURE PRP APPL 2/3 (GAUZE/BANDAGES/DRESSINGS) IMPLANT
BLADE SURG 15 STRL LF C SS BP (BLADE) IMPLANT
BLADE SURG 15 STRL SS (BLADE)
CABLE HIGH FREQUENCY MONO STRZ (ELECTRODE) IMPLANT
CLOTH BEACON ORANGE TIMEOUT ST (SAFETY) ×4 IMPLANT
COVER BACK TABLE 60X90IN (DRAPES) ×4 IMPLANT
COVER LIGHT HANDLE  1/PK (MISCELLANEOUS) ×2
COVER LIGHT HANDLE 1/PK (MISCELLANEOUS) ×2 IMPLANT
DISSECTOR BLUNT TIP ENDO 5MM (MISCELLANEOUS) IMPLANT
DURAPREP 26ML APPLICATOR (WOUND CARE) ×4 IMPLANT
EVACUATOR SMOKE 8.L (FILTER) ×8 IMPLANT
FORCEPS CUTTING 33CM 5MM (CUTTING FORCEPS) IMPLANT
GLOVE BIOGEL PI IND STRL 7.0 (GLOVE) ×10 IMPLANT
GLOVE BIOGEL PI INDICATOR 7.0 (GLOVE) ×10
GLOVE ECLIPSE 6.5 STRL STRAW (GLOVE) ×8 IMPLANT
LIGASURE BLUNT 5MM 37CM (INSTRUMENTS) ×4 IMPLANT
NEEDLE INSUFFLATION 120MM (ENDOMECHANICALS) ×4 IMPLANT
NS IRRIG 1000ML POUR BTL (IV SOLUTION) ×4 IMPLANT
OCCLUDER COLPOPNEUMO (BALLOONS) ×4 IMPLANT
PACK LAPAROSCOPY BASIN (CUSTOM PROCEDURE TRAY) ×4 IMPLANT
PACK ROBOTIC GOWN (GOWN DISPOSABLE) ×4 IMPLANT
PAD TRENDELENBURG POSITION (MISCELLANEOUS) ×4 IMPLANT
POUCH LAPAROSCOPIC INSTRUMENT (MISCELLANEOUS) ×4 IMPLANT
SCISSORS LAP 5X35 DISP (ENDOMECHANICALS) IMPLANT
SET CYSTO W/LG BORE CLAMP LF (SET/KITS/TRAYS/PACK) IMPLANT
SET IRRIG TUBING LAPAROSCOPIC (IRRIGATION / IRRIGATOR) ×4 IMPLANT
SET TRI-LUMEN FLTR TB AIRSEAL (TUBING) ×4 IMPLANT
SHEARS HARMONIC ACE PLUS 36CM (ENDOMECHANICALS) IMPLANT
SLEEVE XCEL OPT CAN 5 100 (ENDOMECHANICALS) ×4 IMPLANT
SOLUTION ELECTROLUBE (MISCELLANEOUS) ×4 IMPLANT
SPATULA 33CM PLASMA (CUTTING FORCEPS) IMPLANT
SUT DVC VLOC 180 2-0 12IN GS21 (SUTURE)
SUT VIC AB 0 CT1 27 (SUTURE) ×4
SUT VIC AB 0 CT1 27XBRD ANBCTR (SUTURE) ×4 IMPLANT
SUT VIC AB 0 CT1 36 (SUTURE) IMPLANT
SUT VIC AB 3-0 PS2 18 (SUTURE)
SUT VIC AB 3-0 PS2 18XBRD (SUTURE) IMPLANT
SUT VICRYL 0 UR6 27IN ABS (SUTURE) IMPLANT
SUT VICRYL 4-0 PS2 18IN ABS (SUTURE) ×4 IMPLANT
SUTURE DVC VL 180 2-0 12INGS21 (SUTURE) IMPLANT
SYR 50ML LL SCALE MARK (SYRINGE) ×4 IMPLANT
SYSTEM CARTER THOMASON II (TROCAR) ×8 IMPLANT
TIP UTERINE 5.1X6CM LAV DISP (MISCELLANEOUS) IMPLANT
TIP UTERINE 6.7X10CM GRN DISP (MISCELLANEOUS) ×4 IMPLANT
TIP UTERINE 6.7X6CM WHT DISP (MISCELLANEOUS) IMPLANT
TIP UTERINE 6.7X8CM BLUE DISP (MISCELLANEOUS) ×4 IMPLANT
TOWEL OR 17X24 6PK STRL BLUE (TOWEL DISPOSABLE) ×8 IMPLANT
TRAY FOLEY CATH SILVER 14FR (SET/KITS/TRAYS/PACK) ×4 IMPLANT
TROCAR BALLN 12MMX100 BLUNT (TROCAR) IMPLANT
TROCAR PORT AIRSEAL 5X120 (TROCAR) IMPLANT
TROCAR PORT AIRSEAL 8X120 (TROCAR) ×4 IMPLANT
TROCAR XCEL NON-BLD 11X100MML (ENDOMECHANICALS) IMPLANT
TROCAR XCEL NON-BLD 5MMX100MML (ENDOMECHANICALS) ×4 IMPLANT
WARMER LAPAROSCOPE (MISCELLANEOUS) ×4 IMPLANT
WATER STERILE IRR 1000ML POUR (IV SOLUTION) ×4 IMPLANT

## 2015-08-31 NOTE — Transfer of Care (Signed)
Immediate Anesthesia Transfer of Care Note  Patient: Stefanie Brown  Procedure(s) Performed: Procedure(s): HYSTERECTOMY TOTAL LAPAROSCOPIC (N/A) BILATERAL SALPINGECTOMY (Bilateral) CYSTO (N/A)  Patient Location: PACU  Anesthesia Type:General  Level of Consciousness: awake, sedated and patient cooperative  Airway & Oxygen Therapy: Patient Spontanous Breathing and Patient connected to nasal cannula oxygen  Post-op Assessment: Report given to RN and Post -op Vital signs reviewed and stable  Post vital signs: Reviewed and stable  Last Vitals:  Filed Vitals:   08/31/15 0606  Pulse: 76  Temp: 36.9 C  Resp: 20    Complications: No apparent anesthesia complications

## 2015-08-31 NOTE — H&P (Signed)
Stefanie Brown is an 50 y.o. female  G31P1011 MWF here for definitive treatment of menorrhagia, anemia, pelvic and back pain, and uterine fibroids.  Pt did have a ultrasound shwoing 2.4 and 1.3cm fibroid.  The second one as submucosal.  She's had two endometrial biopsies in the past year.  The most recent one was 08/21/15 due to AGUS pap smear.  Colposcopy with biopsy and ECC was also negative.  Pt is desirous of definitive treatment.  Alternatives with medication, hysteroscopy with fibroid resection, or conservative management.  Risks and benefits have been discussed and she is here and ready to proceed.  Pertinent Gynecological History: Menses: menorrhagia Contraception: tubal ligation DES exposure: denies Blood transfusions: none Sexually transmitted diseases: no past history Previous GYN Procedures: colposcopy with ECC, biopsy 08/21/15  Last mammogram: normal Date: 4/15 Last pap: AGUS Date: 3/10 OB History: G2, P1   Menstrual History: Patient's last menstrual period was 08/13/2015.    Past Medical History  Diagnosis Date  . Thyroid disease     Hypothyroid  . STD (sexually transmitted disease)     HSV 2  . Hypothyroidism   . Depression   . Anxiety   . GERD (gastroesophageal reflux disease)   . History of hiatal hernia   . Anemia     Past Surgical History  Procedure Laterality Date  . Tubal ligation Bilateral 2001  . Cholecystectomy    . Diagnostic laparoscopy    . Dilation and curettage of uterus      Family History  Problem Relation Age of Onset  . Parkinson's disease Mother   . Cancer Mother     bladder, bladder removed  . Hypertension Father   . Heart attack Father     Social History:  reports that she quit smoking about 3 years ago. Her smoking use included Cigarettes. She has a 12.5 pack-year smoking history. She has never used smokeless tobacco. She reports that she drinks about 1.8 oz of alcohol per week. She reports that she does not use illicit  drugs.  Allergies:  Allergies  Allergen Reactions  . Sulfamethoxazole-Trimethoprim Hives and Other (See Comments)    Childhood allergy  . Sulfonamide Derivatives Other (See Comments)    Childhood allergy    Prescriptions prior to admission  Medication Sig Dispense Refill Last Dose  . acetaminophen (TYLENOL) 500 MG tablet Take 1,000 mg by mouth at bedtime as needed for mild pain or moderate pain.     . cetirizine (ZYRTEC) 10 MG tablet Take 10 mg by mouth daily.   08/30/2015 at Unknown time  . levothyroxine (SYNTHROID, LEVOTHROID) 75 MCG tablet Take 75 mcg by mouth daily before breakfast.    08/30/2015 at Unknown time  . pantoprazole (PROTONIX) 40 MG tablet Take 40 mg by mouth daily.    08/31/2015 at 0615  . oxyCODONE-acetaminophen (PERCOCET) 5-325 MG tablet Take 2 tablets by mouth every 4 (four) hours as needed. use only as much as needed to relieve pain (Patient not taking: Reported on 08/21/2015) 30 tablet 0 Not Taking    Review of Systems  All other systems reviewed and are negative.   Pulse 76, temperature 98.4 F (36.9 C), temperature source Oral, resp. rate 20, last menstrual period 08/13/2015, SpO2 100 %. Physical Exam  Constitutional: She is oriented to person, place, and time. She appears well-developed and well-nourished.  Cardiovascular: Normal rate and regular rhythm.   Respiratory: Effort normal and breath sounds normal.  GI: Soft. Bowel sounds are normal.  Neurological: She is alert  and oriented to person, place, and time.  Skin: Skin is warm and dry.  Psychiatric: She has a normal mood and affect.    Results for orders placed or performed during the hospital encounter of 08/31/15 (from the past 24 hour(s))  Pregnancy, urine     Status: None   Collection Time: 08/31/15  6:00 AM  Result Value Ref Range   Preg Test, Ur NEGATIVE NEGATIVE    No results found.  Assessment/Plan: 50 yo G2P1 MWF here for TLH/bilateral salpingectomy/cystoscopy and possible BSO due to  menorrhagia, anemia, fibroids, and pelvic and back pain.  All questions answered.  Pt here and is ready to proceed.  [name] 08/31/2015, 7:04 AM

## 2015-08-31 NOTE — Progress Notes (Signed)
Day of Surgery Procedure(s) (LRB): HYSTERECTOMY TOTAL LAPAROSCOPIC (N/A) BILATERAL SALPINGECTOMY (Bilateral) CYSTO (N/A)  Subjective: Patient reports no nausea, good pain control.  Has been up to walk.  Voiding without difficulty.  No vaginal bleeding.  Objective: I have reviewed patient's vital signs, intake and output, medications and labs.  General: alert and cooperative Resp: clear to auscultation bilaterally Cardio: regular rate and rhythm, S1, S2 normal, no murmur, click, rub or gallop GI: soft, non-tender; bowel sounds normal; no masses,  no organomegaly and incision: clean, dry and intact Extremities: extremities normal, atraumatic, no cyanosis or edema Vaginal Bleeding: minimal  Assessment: s/p Procedure(s): HYSTERECTOMY TOTAL LAPAROSCOPIC (N/A) BILATERAL SALPINGECTOMY (Bilateral) CYSTO (N/A): stable and progressing well  Plan: Advance diet Encourage ambulation Advance to PO medication  BMP and CBC in am.     [name] 08/31/2015, 6:04 PM

## 2015-08-31 NOTE — Anesthesia Postprocedure Evaluation (Signed)
Anesthesia Post Note  Patient: Alxis Brodnax  Procedure(s) Performed: Procedure(s) (LRB): HYSTERECTOMY TOTAL LAPAROSCOPIC (N/A) BILATERAL SALPINGECTOMY (Bilateral) CYSTO (N/A)  Patient location during evaluation: PACU Anesthesia Type: General Level of consciousness: awake and alert Pain management: pain level controlled Vital Signs Assessment: post-procedure vital signs reviewed and stable Respiratory status: spontaneous breathing, nonlabored ventilation, respiratory function stable and patient connected to nasal cannula oxygen Cardiovascular status: blood pressure returned to baseline and stable Postop Assessment: no signs of nausea or vomiting Anesthetic complications: no    Last Vitals:  Filed Vitals:   08/31/15 1015 08/31/15 1030  BP: 118/78 121/76  Pulse: 81 83  Temp:    Resp: 15 16    Last Pain:  Filed Vitals:   08/31/15 1037  PainSc: 4                  Kayan Blissett JENNETTE

## 2015-08-31 NOTE — Op Note (Signed)
08/31/2015  10:00 AM  PATIENT:  Stefanie Brown  50 y.o. female  PRE-OPERATIVE DIAGNOSIS:  Menorrhagia, fibroid uterus, anemia, pelvic and low back pain  POST-OPERATIVE DIAGNOSIS:  Menorrhagia, fibroid uterus, anemia, pelvic and low back pain  PROCEDURE:  Procedure(s): HYSTERECTOMY TOTAL LAPAROSCOPIC BILATERAL SALPINGECTOMY CYSTOSCOPY  SURGEON:  Aziah Brostrom SUZANNE  ASSISTANTS: Sumner Boast, MD   ANESTHESIA:   general  ESTIMATED BLOOD LOSS:  25cc  BLOOD ADMINISTERED:none   FLUIDS: 1600cc LR  UOP: 150cc clear  SPECIMEN:  Uterus, cervix, and fallopian tubes  DISPOSITION OF SPECIMEN:  PATHOLOGY  FINDINGS: enlarged and bulky uterus, h/o tubal ligation  DESCRIPTION OF OPERATION: Patient is taken to the operating room. She is placed in the supine position. She is a running IV in place. Informed consent was present on the chart. SCDs on her lower extremities and functioning properly. General endotracheal anesthesia was administered by the anesthesia staff without difficulty.  Once adequate anesthesia was confirmed the legs are placed in the low lithotomy position in [place]. Her arms were tucked by the side.   Dura prep was then used to prep the abdomen and Betadine was used to prep the inner thighs, perineum and vagina. Once 3 minutes had past the patient was draped in a normal standard fashion. The legs were lifted to the high lithotomy position. The cervix was visualized by placing a heavy weighted speculum in the posterior aspect of the vagina and using a curved Deaver retractor to the retract anteriorly. The anterior lip of the cervix was grasped with single-tooth tenaculum.  The cervix sounded to 9.5 cm. Pratt dilators were used to dilate the cervix up to a #21. A RUMI uterine manipulator was obtained. A #8 disposable tip was placed on the RUMI manipulator as well as a small, metal  KOH ring. This was passed through the cervix and the bulb of the disposable tip was inflated  with 10 cc of normal saline. There was a good fit of the KOH ring around the cervix. The tenaculum was removed. There is also good manipulation of the uterus. The speculum and retractor were removed as well. A Foley catheter was placed to straight drain. The concentrated urine was noted. Legs were lowered to the low lithotomy position and attention was turned the abdomen.  The umbilicus was everted.  A Veress needle was obtained. Syringe of sterile saline was placed on a open Veress needle.  Incision made in the umbilicus with 0000000 blade.  Then the Veress needle was passed into the umbilicus until just when the fluid started to drip.  Then low flow CO2 gas was attached the needle and the pneumoperitoneum was achieved without difficulty. Once 2.0 liters of gas was in the abdomen the Veress needle was removed and a 5 millimeter non-bladed Optiview trocar and port were passed directly to the abdomen. The laparoscope was then used to confirm intraperitoneal placement. A bulky uterus with fibroids was noted.  H/O tubal ligation was noted with a portion of each tube missing.  Ovaries were normal.  Upper abdomen was normal.  Photo documentation was made.  Locations for RLQ and LLQ ports were noted by transillumination of the abdominal wall.  0.25% marcaine was used to anesthetize the skin.  72mm skin incisions were made and then 85mm bladed ports were placed.  Finally a midline incision was made about 4 cm above the pubic symphysis.  The skin was incised about 1cm and a non-bladed 11 port was placed with direct visualization of the  laparoscope.    Ureters were identifies.  Attention was turned to the left side. With uterus on stretch the left tube was excised off the ovary and mesosalpinx was dissected to free the tube. Then the left utero-ovarian pedicle was serially clamped cauterized and incised using the ligasure device. Left round ligament was serially clamped cauterized and incised. The anterior and posterior  peritoneum of the inferior leaf of the broad ligament were opened. The beginning of the baldde flap was created.  The bladder was taken down below the level of the KOH ring. The left uterine artery skeletonized and then just superior to the KOH ring this vessel was serially clamped, cauterized, and incised.  Attention was turned the right side.  The uterus was placed on stretch to the opposite side.  The tube was excised off the ovary using sharp dissection a bipolar cautery.  The mesosalpinx was incised freeing the tube. Then the right uterine ovarian pedicle was serially clamped cauterized and incised. Next the right round ligament was serially clamped cauterized and incised. The anterior posterior peritoneum of the inferior leaf of the broad ligament were opened. The anterior peritoneum was carried across to the dissection on the left side. The remainder of the bladder flap was created using sharp dissection. The bladder was well below the level of the KOH ring. The left uterine artery skeletonized. Then the left uterine artery, above the level of the KOH ring, was serially clamped cauterized and incised. The uterus was devascularized at this point.  The colpotomy was performed a starting in the midline and using a harmonic scalpel with the inferior edge of the open blade  This was carried around a circumferential fashion until the vaginal mucosa was completely incised in the specimen was freed.  The specimen was then delivered to the vagina.  A vaginal occlusive device was used to maintain the pneumoperitoneum  Instruments were changed with a needle driver and cobra grasper.  Using a 9 inch V. lock suture, the cuff was closed by incorporating the anterior and posterior vaginal mucosa in each stitch. This was carried across all the way to the left corner and a running fashion. Two stitches were brought back towards the midline and the suture was cut flush with the vagina. The needle was brought out the  pelvis. The pelvis was irrigated. All pedicles were inspected. No bleeding was noted. In Interceed was placed across vaginal cuff. Ureters were noted deep in the pelvis to be peristalsing.  At this point the procedure was completed. The largest port was closed with a fascial closure device after the port was removed.  Suture was tied and excellent closure of the fascia was noted.  Pneumoperitoneum was relived.  The remaining instruments were removed.  The ports were removed under direct visualization of the laparoscope.  The patient was taken out of Trendelenburg positioning.  Several deep breaths were given to the patient's trying to any gas the abdomen and finally the midline port was removed.  The skin was then closed with subcuticular stitches of 3-0 Vicryl. The skin was cleansed Dermabond was applied. Attention was then turned the vagina and the cuff was inspected. No bleeding was noted. The anterior posterior vaginal mucosa was incorporated in each stitch. The Foley catheter was removed.  Cystoscopy was performed.  No sutures or bladder injuries were noted.  Ureters were noted with normal urine jets from each one was seen.  Sponge, lap, needle, initially counts were correct x2. Patient tolerated the procedure very  well. She was awakened from anesthesia, extubated and taken to recovery in stable condition.   COUNTS:  YES  PLAN OF CARE: Transfer to PACU

## 2015-08-31 NOTE — Anesthesia Procedure Notes (Signed)
Procedure Name: Intubation Date/Time: 08/31/2015 7:32 AM Performed by: Vernice Jefferson Pre-anesthesia Checklist: Patient identified, Emergency Drugs available, Suction available and Patient being monitored Patient Re-evaluated:Patient Re-evaluated prior to inductionOxygen Delivery Method: Circle system utilized Preoxygenation: Pre-oxygenation with 100% oxygen Intubation Type: IV induction Ventilation: Mask ventilation without difficulty Laryngoscope Size: Mac and 3 Grade View: Grade II Tube type: Oral Tube size: 7.0 mm Number of attempts: 1 Airway Equipment and Method: Stylet Placement Confirmation: ETT inserted through vocal cords under direct vision,  positive ETCO2 and breath sounds checked- equal and bilateral Secured at: 22 (at lip) cm Tube secured with: Tape Dental Injury: Teeth and Oropharynx as per pre-operative assessment

## 2015-09-01 ENCOUNTER — Encounter (HOSPITAL_COMMUNITY): Payer: Self-pay | Admitting: Obstetrics & Gynecology

## 2015-09-01 DIAGNOSIS — N920 Excessive and frequent menstruation with regular cycle: Secondary | ICD-10-CM | POA: Diagnosis not present

## 2015-09-01 DIAGNOSIS — Z90710 Acquired absence of both cervix and uterus: Secondary | ICD-10-CM | POA: Insufficient documentation

## 2015-09-01 LAB — HEMOGLOBIN AND HEMATOCRIT, BLOOD
HEMATOCRIT: 29.6 % — AB (ref 36.0–46.0)
HEMOGLOBIN: 9.2 g/dL — AB (ref 12.0–15.0)

## 2015-09-01 LAB — BASIC METABOLIC PANEL
Anion gap: 6 (ref 5–15)
BUN: 11 mg/dL (ref 6–20)
CALCIUM: 7.4 mg/dL — AB (ref 8.9–10.3)
CO2: 25 mmol/L (ref 22–32)
Chloride: 101 mmol/L (ref 101–111)
Creatinine, Ser: 0.84 mg/dL (ref 0.44–1.00)
GFR calc Af Amer: >60 mL/min (ref >60)
GLUCOSE: 144 mg/dL — AB (ref 65–99)
Potassium: 3.6 mmol/L (ref 3.5–5.1)
Sodium: 132 mmol/L — ABNORMAL LOW (ref 135–145)

## 2015-09-01 LAB — CBC
HEMATOCRIT: 27.5 % — AB (ref 36.0–46.0)
Hemoglobin: 8.7 g/dL — ABNORMAL LOW (ref 12.0–15.0)
MCH: 23.9 pg — AB (ref 26.0–34.0)
MCHC: 31.6 g/dL (ref 30.0–36.0)
MCV: 75.5 fL — ABNORMAL LOW (ref 78.0–100.0)
Platelets: 171 10*3/uL (ref 150–400)
RBC: 3.64 MIL/uL — ABNORMAL LOW (ref 3.87–5.11)
RDW: 17.5 % — AB (ref 11.5–15.5)
WBC: 8.9 10*3/uL (ref 4.0–10.5)

## 2015-09-01 MED ORDER — IBUPROFEN 800 MG PO TABS: 800.0000 mg | ORAL_TABLET | Freq: Three times a day (TID) | ORAL | Status: DC | PRN

## 2015-09-01 MED ORDER — PANTOPRAZOLE SODIUM 40 MG PO TBEC: 40.0000 mg | DELAYED_RELEASE_TABLET | Freq: Every day | ORAL | Status: DC

## 2015-09-01 NOTE — Discharge Instructions (Signed)

## 2015-09-01 NOTE — Progress Notes (Signed)
1 Day Post-Op Procedure(s) (LRB): HYSTERECTOMY TOTAL LAPAROSCOPIC (N/A) BILATERAL SALPINGECTOMY (Bilateral) CYSTO (N/A)  Subjective: Patient reports no complaints.  Good pain control.  Tolerating regular diet. No nausea.  No trouble voiding.  No trouble walking.  Reports she feels the nursing care has been so good.  Objective: I have reviewed patient's vital signs, intake and output, medications and labs. Tm: 99.1, R: 14-18 P: 55-77 BP100-136/58-80  UOP:  1950 Hb: 8.7 decreased from 11.3  General: alert and cooperative Resp: clear to auscultation bilaterally Cardio: regular rate and rhythm, S1, S2 normal, no murmur, click, rub or gallop GI: soft, non-tender; bowel sounds normal; no masses,  no organomegaly and incision: clean, dry and intact Extremities: extremities normal, atraumatic, no cyanosis or edema Vaginal Bleeding: none  Assessment: s/p Procedure(s): HYSTERECTOMY TOTAL LAPAROSCOPIC (N/A) BILATERAL SALPINGECTOMY (Bilateral) CYSTO (N/A): stable, progressing well and anemia  Plan: Plan to repeat Hb at 3pm today due to decrease with minimal EBL during surgery.  Clinically stable.      [name] 09/01/2015, 10:13 AM

## 2015-09-01 NOTE — Progress Notes (Signed)
The patient is receiving protnonix by the intravenous route.  Based on criteria approved by the Pharmacy and [facility], the medication is being converted to the equivalent oral dose form.  These criteria include: -No Active GI bleeding -Able to tolerate diet of full liquids (or better) or tube feeding -Able to tolerate other medications by the oral or enteral route  If you have any questions about this conversion, please contact the Pharmacy Department (ext [phone]).  Thank you.  Beryle Lathe 09/01/2015

## 2015-09-01 NOTE — Progress Notes (Signed)
Pt is discharged in the care of husband;with R.N. Escort. Denies pain or discomfort. Discharged instructions with Rx  Were given to pt. Questions asked and answered. Spirits are good. Abdominal incisions are clean and dry. Stable, Understands all discharged instuctions well.

## 2015-09-01 NOTE — Discharge Summary (Signed)
Physician Discharge Summary  Patient ID: Stefanie Brown MRN: [id number] DOB/AGE: 12-04-1965 50 y.o.  Admit date: 08/31/2015 Discharge date: 09/01/2015  Admission Diagnoses: symptomatic uterine fibroids, menorrhagia, iron def anemia, pelvic and back pain  Discharge Diagnoses:  Active Problems:   Iron deficiency anemia   Discharged Condition: good  Hospital Course: Patient admitted through same day surgery.  She was taken to OR where TLH/bilateral salpingectomy/cystoscopy were performed.  Surgical findings included enlarged, fibroid uterus.  Surgery was uneventful.  EBL 25cc.  Foley catheter was removed before leaving OR.  Patient transferred to PACU where she was stable and then to 3rd floor for the remainder of her hospitalization.  During her post-op recovery, her vitals and stable and she was AF.  In evening of POD#0, she was able to transition to oral pain medications and regular diet.  She was able to ambulate and she had good pain control.  She was also able to void on her own.  Patient seen both in the evening of POD#0 and AM of POD#1.  In the AM of POD#1, she was without complaint.  Post op hb was 8.7, decreased from 11.3, pre-operatively.  Her exam and vitals were completely stable and appropriate.  Repeat hb about 10 hours later was repeated and was 9.2.  Due to stable clinical exam, and stable hemoglobin, discharge was felt appropriate.  At this point, patient was voiding, walking, having excellent pain control, had no nausea, and minimal vaginal bleeding.  She was ready for D/C.  Consults: None  Significant Diagnostic Studies: labs: CBC with post op hb 8.7.  Repeat 9.2.  Treatments: surgery: TLH/bilateral salpingectomy/cystoscopy  Discharge Exam: Blood pressure 101/67, pulse 52, temperature 98.9 F (37.2 C), temperature source Oral, resp. rate 18, last menstrual period 08/13/2015, SpO2 100 %. General appearance: alert and cooperative Resp: clear to auscultation  bilaterally Cardio: regular rate and rhythm, S1, S2 normal, no murmur, click, rub or gallop GI: soft, non-tender; bowel sounds normal; no masses,  no organomegaly Extremities: extremities normal, atraumatic, no cyanosis or edema Incision/Wound:c/d/i  Disposition: 01-Home or Self Care     Medication List    TAKE these medications        acetaminophen 500 MG tablet  Commonly known as:  TYLENOL  Take 1,000 mg by mouth at bedtime as needed for mild pain or moderate pain.     cetirizine 10 MG tablet  Commonly known as:  ZYRTEC  Take 10 mg by mouth daily.     ibuprofen 800 MG tablet  Commonly known as:  ADVIL,MOTRIN  Take 1 tablet (800 mg total) by mouth every 8 (eight) hours as needed.     levothyroxine 75 MCG tablet  Commonly known as:  SYNTHROID, LEVOTHROID  Take 75 mcg by mouth daily before breakfast.     oxyCODONE-acetaminophen 5-325 MG tablet  Commonly known as:  PERCOCET  Take 2 tablets by mouth every 4 (four) hours as needed. use only as much as needed to relieve pain     pantoprazole 40 MG tablet  Commonly known as:  PROTONIX  Take 40 mg by mouth daily.           Follow-up Information    Follow up with Lyman Speller, MD On 09/07/2015.   Specialty:  Gynecology   Why:  Pt already has appt time and date   Contact information:   [address] [place] [address] [phone]       Signed: Lyman Speller 09/01/2015, 4:21  PM   

## 2015-09-01 NOTE — Progress Notes (Signed)
  Repeat Hb 9.2.  Status still stable.  Plan D/C home.  Hale Bogus [facility] 09/01/2015, 4:17 PM

## 2015-09-02 ENCOUNTER — Telehealth: Payer: Self-pay | Admitting: *Deleted

## 2015-09-02 NOTE — Telephone Encounter (Signed)
Patient returned call. States she is doing well at home. Feeling good, only taking Motrin for pain. Voiding well and passing gas. No BM yet but taking Colace.  Encouraged warm liquids and ambulation, position change while resting and dietary ways to promote bowel function. Denies vaginal bleeding. Advised of pathology results as directed by Dr Sabra Heck. Has post op appointment on Monday with Dr Sabra Heck. Encouraged to call if any needs or questions before then.   Routing to provider for final review. Patient agreeable to disposition. Will close encounter.

## 2015-09-02 NOTE — Telephone Encounter (Signed)
-----   Message from Megan Salon, MD sent at 09/02/2015  7:22 AM EDT ----- Please call and check on pt.  She was discharged yesterday.  Pathology was benign.  She had 10 fibroids.  No abnormal cells were seen.  Thanks.

## 2015-09-02 NOTE — Telephone Encounter (Signed)
Call to patient, left message to call back. 

## 2015-09-07 ENCOUNTER — Encounter: Payer: Self-pay | Admitting: Obstetrics & Gynecology

## 2015-09-07 ENCOUNTER — Ambulatory Visit (INDEPENDENT_AMBULATORY_CARE_PROVIDER_SITE_OTHER): Payer: BC Managed Care – PPO | Admitting: Obstetrics & Gynecology

## 2015-09-07 VITALS — BP 120/60 | HR 90 | Resp 16 | Ht 67.25 in | Wt 208.0 lb

## 2015-09-07 DIAGNOSIS — Z9889 Other specified postprocedural states: Secondary | ICD-10-CM

## 2015-09-07 DIAGNOSIS — M5440 Lumbago with sciatica, unspecified side: Secondary | ICD-10-CM

## 2015-09-07 NOTE — Progress Notes (Signed)
Post Operative Visit  Procedure: HYSTERECTOMY TOTAL LAPAROSCOPIC  BILATERAL SALPINGECTOMY (Bilateral )  CYSTO (N/A )   Days Post-op: 7 days   Subjective: Doing well.  Reports she stopped the Percocet on Friday.  Pt is having regular bowel movements.  These started on Thursday.  Used smooth move with a dash of Miralax.  Voiding without trouble.  Taking Motrin 800mg  BID.  No vaginal bleeding.  She is still having back pain.  When she was taking two Percocet, the back pain was gone or she didn't care about it.  She did feel it with one.    Got out yesterday and went to church.    Objective: LMP 08/13/2015 (Exact Date)  EXAM General: alert and cooperative Resp: clear to auscultation bilaterally Cardio: regular rate and rhythm, S1, S2 normal, no murmur, click, rub or gallop GI: soft, non-tender; bowel sounds normal; no masses,  no organomegaly and incision: clean, dry and intact Extremities: extremities normal, atraumatic, no cyanosis or edema Vaginal Bleeding: none  Assessment: s/p Laparoscopic TLH/bilalteral salpingectomy/cystoscopy due to fibroids, anemia, iron deficiency Low back pain, possible sciatica?  Plan: Recheck 3 weeks Pt would like to see another ortho.  Release of records obtained from prior.  She will need to take MRI results with her.  We will try and schedule this for the same day as her follow-up appt.

## 2015-09-07 NOTE — Addendum Note (Signed)
Addended by: Megan Salon on: 09/07/2015 10:17 PM   Modules accepted: Orders

## 2015-09-28 ENCOUNTER — Encounter: Payer: Self-pay | Admitting: Obstetrics & Gynecology

## 2015-09-28 ENCOUNTER — Ambulatory Visit (INDEPENDENT_AMBULATORY_CARE_PROVIDER_SITE_OTHER): Payer: BC Managed Care – PPO | Admitting: Obstetrics & Gynecology

## 2015-09-28 VITALS — BP 116/70 | HR 80 | Resp 16 | Ht 67.25 in | Wt 211.0 lb

## 2015-09-28 DIAGNOSIS — Z9889 Other specified postprocedural states: Secondary | ICD-10-CM

## 2015-09-28 DIAGNOSIS — D500 Iron deficiency anemia secondary to blood loss (chronic): Secondary | ICD-10-CM

## 2015-09-28 MED ORDER — PROMETHAZINE-CODEINE 6.25-10 MG/5ML PO SYRP: 5.0000 mL | ORAL_SOLUTION | Freq: Four times a day (QID) | ORAL | Status: DC | PRN

## 2015-09-28 NOTE — Progress Notes (Signed)
Post Operative Visit  Procedure:HYSTERECTOMY TOTAL LAPAROSCOPIC (N/A ) BILATERAL SALPINGECTOMY (Bilateral ) CYSTO (N/A )    Days Post-op: 28 days  Subjective: Doing well.  Energy is improving.  Saw Dr. Lynann Bologna today.  Having MRI of back.  Pt to return to work next week.  Doesn't want to as she feels being out of work has made so many of her problems go away.  Blood pressures are normal.  She is hopeful that the back will improve as well.  Pt and I discussed repeat lab work when she comes for her MRI in June.  Scheduled out this long per pt request.  Objective: BP 116/70 mmHg  Pulse 80  Resp 16  Ht 5' 7.25" (1.708 m)  Wt 211 lb (95.709 kg)  BMI 32.81 kg/m2  LMP 08/13/2015 (Exact Date)  EXAM General: alert and cooperative Resp: clear to auscultation bilaterally Cardio: regular rate and rhythm, S1, S2 normal, no murmur, click, rub or gallop GI: soft, non-tender; bowel sounds normal; no masses,  no organomegaly and incision: clean, dry and intact Extremities: extremities normal, atraumatic, no cyanosis or edema Vaginal Bleeding: none  Gyn:  NAEFG, no bleeding or discharge, cuff intact, no masses  Assessment: s/p TLH/Bilateral salpingectomy/cystoscopy Low back pain, seeing ortho Anemia  Plan: Recheck 1 year or with new issues/problems Repeat CBC and ferritin in June.  Order placed. Released to return to work.  No SA for 8 full weeks post op reinforced.

## 2015-09-30 ENCOUNTER — Encounter: Payer: Self-pay | Admitting: Obstetrics & Gynecology

## 2015-10-29 ENCOUNTER — Telehealth: Payer: Self-pay | Admitting: Obstetrics & Gynecology

## 2015-10-29 NOTE — Telephone Encounter (Signed)
Spoke with Lamont Snowball RN. Need to offer patient appointment today. Will need to come to the office now to be seen. Return call to patient who states she is in [place] and can not make it to the office today. Appointment scheduled for tomorrow 10/29/2015 at 2 pm with Dr.[place]. She is agreeable to date and time. Aware if her pain worsens or develops new symptoms over night will need to be seen with a local ER.  Routing to provider for final review. Patient agreeable to disposition. Will close encounter.

## 2015-10-29 NOTE — Telephone Encounter (Signed)
Patient is 8 weeks post surgery and having some pain.

## 2015-10-29 NOTE — Telephone Encounter (Signed)
Spoke with patient. Patient had a total laparoscopic hysterectomy on 08/31/2015. Reports she is experiencing low pelvic pain that feels like a "raw pressure and burning." Patient had intercourse this week for the first time, but reports this started prior to having intercourse. Discomfort started around 2 days ago. Denies any bleeding, discharge, fever, or chills. Reports she feels pain gets worse when she has to use the restroom. Denies any burning with urination, lower back pain, or urinary frequency. Advised patient she will need to be seen in the office for further evaluation. She is agreeable. 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 Is unable to be seen for evaluation before 3 pm. Advised I will speak with nursing supervisor regarding scheduling and return call. She is agreeable.

## 2015-10-30 ENCOUNTER — Ambulatory Visit (INDEPENDENT_AMBULATORY_CARE_PROVIDER_SITE_OTHER): Payer: BC Managed Care – PPO | Admitting: Obstetrics & Gynecology

## 2015-10-30 VITALS — BP 138/86 | HR 84 | Resp 12 | Wt 208.6 lb

## 2015-10-30 DIAGNOSIS — R102 Pelvic and perineal pain: Secondary | ICD-10-CM | POA: Diagnosis not present

## 2015-10-30 DIAGNOSIS — N9489 Other specified conditions associated with female genital organs and menstrual cycle: Secondary | ICD-10-CM | POA: Diagnosis not present

## 2015-10-30 DIAGNOSIS — N941 Unspecified dyspareunia: Secondary | ICD-10-CM

## 2015-10-30 MED ORDER — CYCLOBENZAPRINE HCL 10 MG PO TABS: 10.0000 mg | ORAL_TABLET | Freq: Three times a day (TID) | ORAL | Status: DC | PRN

## 2015-10-30 NOTE — Progress Notes (Signed)
GYNECOLOGY  VISIT   HPI: 50 y.o. [id number] Married Caucasian female here for complaint if pain with intercourse.  Pt has not attempted intercourse much since surgery on 3/210/17.  Has done well from healing standpoint.  Denies vaginal bleeding or vaginal discharge.  She states the pain was low and more on the right side.  It was sharp but also felt like there was a spasm with it.  Denies issues with bowel or bladder habits.  Does not have back MRI scheduled until around middle of June, after school is out.  A little tearful today because she is anxious "something is wrong".  Denies fevers.    GYNECOLOGIC HISTORY: Patient's last menstrual period was 08/13/2015 (exact date). Contraception: Hysterectomy 3/17 Menopausal hormone therapy: none  Patient Active Problem List   Diagnosis Date Noted  . H/O: hysterectomy 09/01/2015  . Iron deficiency anemia 08/31/2015  . 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 History of cholecystectomy 06/30/2014  . 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 Lower esophageal ring 05/11/2010  . GERD 04/27/2010  . DYSPHAGIA 04/27/2010  . ABDOMINAL PAIN-EPIGASTRIC 04/27/2010  . 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 Asthma, intermittent 11/18/2009  . Adult hypothyroidism 08/05/2009  . Hypercholesteremia 08/08/2005    Past Medical History  Diagnosis Date  . Thyroid disease     Hypothyroid  . STD (sexually transmitted disease)     HSV 2  . Hypothyroidism   . Depression   . Anxiety   . GERD (gastroesophageal reflux disease)   . History of hiatal hernia   . Anemia     Past Surgical History  Procedure Laterality Date  . Tubal ligation Bilateral 2001  . Cholecystectomy    . Diagnostic laparoscopy    . Dilation and curettage of uterus    . Laparoscopic hysterectomy N/A 08/31/2015    Procedure: HYSTERECTOMY TOTAL LAPAROSCOPIC;  Surgeon: Megan Salon, MD;  Location: [facility];  Service: Gynecology;  Laterality: N/A;  . Bilateral salpingectomy Bilateral 08/31/2015    Procedure: BILATERAL  SALPINGECTOMY;  Surgeon: Megan Salon, MD;  Location: [facility];  Service: Gynecology;  Laterality: Bilateral;  . Cysto N/A 08/31/2015    Procedure: CYSTO;  Surgeon: Megan Salon, MD;  Location: [facility];  Service: Gynecology;  Laterality: N/A;  . Abdominal hysterectomy      MEDS:  Reviewed in EPIC and UTD  ALLERGIES: Sulfamethoxazole-trimethoprim and Sulfonamide derivatives  Family History  Problem Relation Age of Onset  . Parkinson's disease Mother   . Cancer Mother     bladder, bladder removed  . Hypertension Father   . Heart attack Father    SH:  Married, non smoker  Review of Systems  All other systems reviewed and are negative.   PHYSICAL EXAMINATION:    BP 138/86 mmHg  Pulse 84  Resp 12  Wt 208 lb 9.6 oz (94.62 kg)  LMP 08/13/2015 (Exact Date)    General appearance: alert, cooperative and appears stated age Abdomen: soft, non-tender; bowel sounds normal; no masses,  no organomegaly Incision locations all well healed  Pelvic: External genitalia:  no lesions              Urethra:  normal appearing urethra with no masses, tenderness or lesions              Bartholins and Skenes: normal                 Vagina: normal appearing vagina with normal color and discharge, no lesions, no blood or discharge present, cuff  well approximated and no evidence of dehiscence               Cervix: absent              Bimanual Exam:  Uterus:  uterus absent              Adnexa: no mass, fullness, tenderness              Anus:  normal sphincter tone, no lesions  Significant tenderness along pelvic floor R>L.  This is exact same pt pt felt with intercourse  Chaperone was present for exam.  Assessment: Dyspareunia Pelvic floor pain Low back pain  Plan: MRI of back is scheduled for June 19th Trial of flexeril 10mg  qhs #30/0RF Referral to pelvic PT, Ileana Roup will be made.  Pt would like to go ahead and start this.   Pt reassured about post operative healing and that this looks very  good.

## 2015-11-01 ENCOUNTER — Encounter: Payer: Self-pay | Admitting: Obstetrics & Gynecology

## 2015-11-16 ENCOUNTER — Telehealth: Payer: Self-pay | Admitting: Obstetrics & Gynecology

## 2015-11-16 NOTE — Telephone Encounter (Signed)
Documentation that several attempts were made with voicemails by [facility] and our office to contact patient in regards to her referral. Ok to close per Dr Sabra Heck.

## 2015-11-30 ENCOUNTER — Other Ambulatory Visit (INDEPENDENT_AMBULATORY_CARE_PROVIDER_SITE_OTHER): Payer: BC Managed Care – PPO

## 2015-11-30 ENCOUNTER — Encounter: Payer: Self-pay | Admitting: Obstetrics & Gynecology

## 2015-11-30 ENCOUNTER — Ambulatory Visit (INDEPENDENT_AMBULATORY_CARE_PROVIDER_SITE_OTHER): Payer: BC Managed Care – PPO | Admitting: Obstetrics & Gynecology

## 2015-11-30 VITALS — BP 134/86 | HR 82 | Resp 18 | Wt 209.0 lb

## 2015-11-30 DIAGNOSIS — D500 Iron deficiency anemia secondary to blood loss (chronic): Secondary | ICD-10-CM

## 2015-11-30 DIAGNOSIS — R3915 Urgency of urination: Secondary | ICD-10-CM

## 2015-11-30 LAB — POCT URINALYSIS DIPSTICK
Bilirubin, UA: NEGATIVE
Blood, UA: NEGATIVE
Glucose, UA: NEGATIVE
Ketones, UA: NEGATIVE
Leukocytes, UA: NEGATIVE
Nitrite, UA: NEGATIVE
PROTEIN UA: NEGATIVE
UROBILINOGEN UA: NEGATIVE
pH, UA: 6

## 2015-11-30 LAB — CBC
HEMATOCRIT: 37.7 % (ref 35.0–45.0)
Hemoglobin: 11.9 g/dL (ref 11.7–15.5)
MCH: 23.9 pg — ABNORMAL LOW (ref 27.0–33.0)
MCHC: 31.6 g/dL — ABNORMAL LOW (ref 32.0–36.0)
MCV: 75.7 fL — AB (ref 80.0–100.0)
MPV: 9 fL (ref 7.5–12.5)
PLATELETS: 232 10*3/uL (ref 140–400)
RBC: 4.98 MIL/uL (ref 3.80–5.10)
RDW: 17 % — ABNORMAL HIGH (ref 11.0–15.0)
WBC: 6.9 10*3/uL (ref 3.8–10.8)

## 2015-11-30 LAB — IRON: Iron: 33 ug/dL — ABNORMAL LOW (ref 40–190)

## 2015-11-30 LAB — FERRITIN: Ferritin: 8 ng/mL — ABNORMAL LOW (ref 10–232)

## 2015-12-01 LAB — URINE CULTURE

## 2015-12-02 NOTE — Progress Notes (Signed)
Pt came in for lab work and reported possible UTI.  Dip was negative.  Culture pending.  Pt will be called with results.  No appt was done today.

## 2015-12-04 ENCOUNTER — Telehealth: Payer: Self-pay | Admitting: *Deleted

## 2015-12-04 DIAGNOSIS — D649 Anemia, unspecified: Secondary | ICD-10-CM

## 2015-12-04 NOTE — Telephone Encounter (Signed)
Patient returned call. Notified of results as directed by Dr Sabra Heck and recommendation for referral to Dr Ennever/hematology for iron transfusion. Patient is agreeable. Patient reports MRI results from Dr Lynann Bologna show nerve damage and arthritis and she is scheduled for appointment on Monday to discuss. Advised she will be called with hematology appointment.   Routing to provider for final review. Patient agreeable to disposition. Will close encounter.

## 2015-12-04 NOTE — Telephone Encounter (Signed)
Call to patient, left message to call back. Can speak to triage nurse.

## 2015-12-04 NOTE — Telephone Encounter (Signed)
-----   Message from Megan Salon, MD sent at 12/04/2015  5:31 AM EDT ----- Please call pt.  Hb is much better at 11.9.  Iron levels are still low.  I'd like to refer her to hematology for an iron transfusion.  This will get her iron stores much better and much more quickly than taking oral iron for months.  If ok with her, ok to place referral to Dr. Marin Olp or his PA, [name].

## 2015-12-07 ENCOUNTER — Telehealth: Payer: Self-pay | Admitting: Obstetrics & Gynecology

## 2015-12-07 ENCOUNTER — Other Ambulatory Visit: Payer: Self-pay | Admitting: Obstetrics & Gynecology

## 2015-12-07 DIAGNOSIS — D500 Iron deficiency anemia secondary to blood loss (chronic): Secondary | ICD-10-CM

## 2015-12-07 NOTE — Telephone Encounter (Signed)
Call to patient. Notified Dr Sabra Heck has made referral to Dr Lorie Apley office. Referral coordinator will call her back with appointment.   Encounter closed.   CC: Stefanie Brown

## 2015-12-07 NOTE — Telephone Encounter (Signed)
Call to patient. Left message to call back.  

## 2015-12-07 NOTE — Telephone Encounter (Signed)
Patient is returning a call to Sally. °

## 2015-12-07 NOTE — Telephone Encounter (Signed)
Return call to patient. States she is concerned about the long term anemia and is wondering if perhaps she also needs GI referral? Would like referral in [place] if recommended.

## 2015-12-07 NOTE — Telephone Encounter (Signed)
I will place an referral for Dr. Collene Mares or Dr. Benson Norway.

## 2015-12-07 NOTE — Telephone Encounter (Signed)
Patient returning call.

## 2015-12-11 IMAGING — CT CT ABD-PELV W/O CM
2 of 4 series · 17 of 46 positions shown, 19 images · non-contrast
Comparison: None.

CLINICAL DATA: 47-year-old female with flank pain lower abdominal
pain. Initial encounter.

EXAM:
CT ABDOMEN AND PELVIS WITHOUT CONTRAST
TECHNIQUE: Multidetector CT imaging of the abdomen and pelvis was performed
following the standard protocol without IV contrast.

[Series 2: stone study 5.0 i30f 1 · axial · 0.72mm/px · z∈[-495,-45]mm · 14 of 100 slices shown, 16 images]
[im 5/100  soft-tissue]
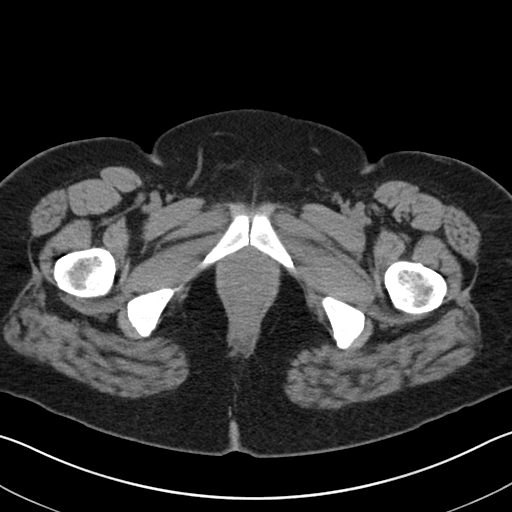
[im 5/100  bone]
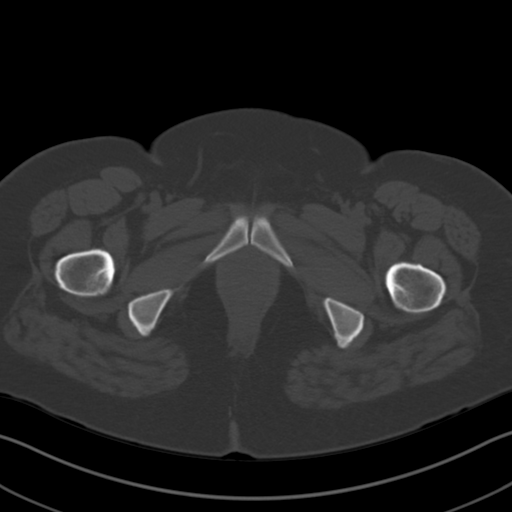
[im 13/100  soft-tissue]
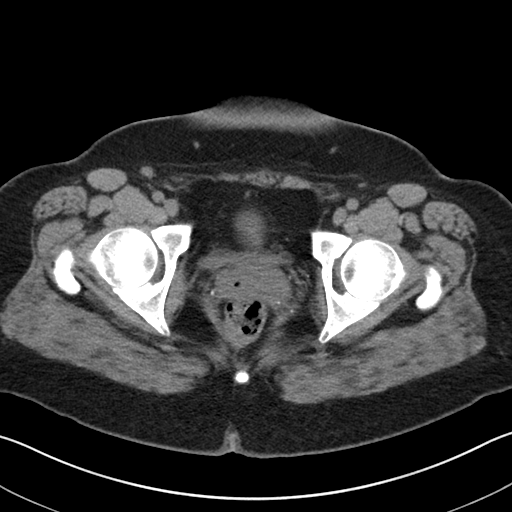
[im 21/100  soft-tissue]
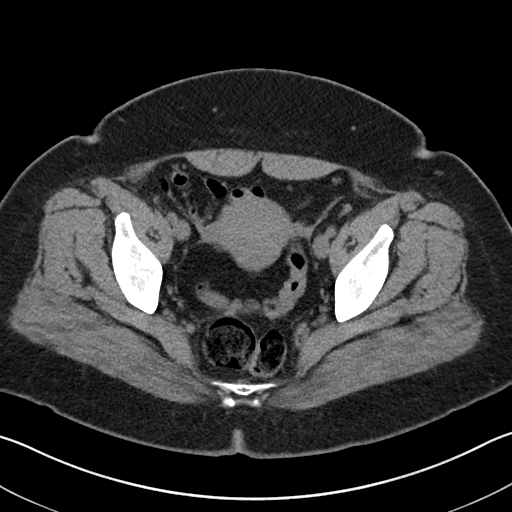
[im 25/100  soft-tissue]
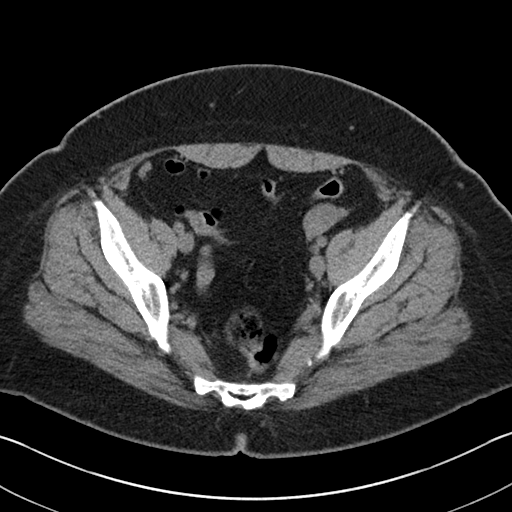
[im 34/100  soft-tissue]
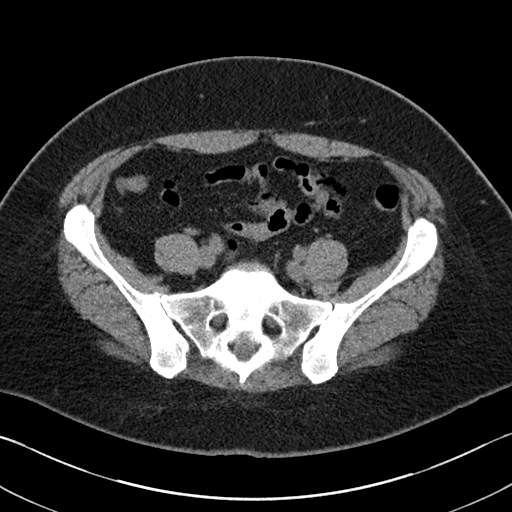
[im 42/100  soft-tissue]
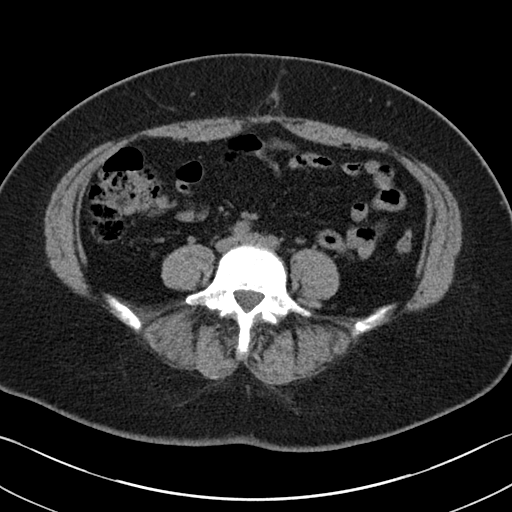
[im 46/100  soft-tissue]
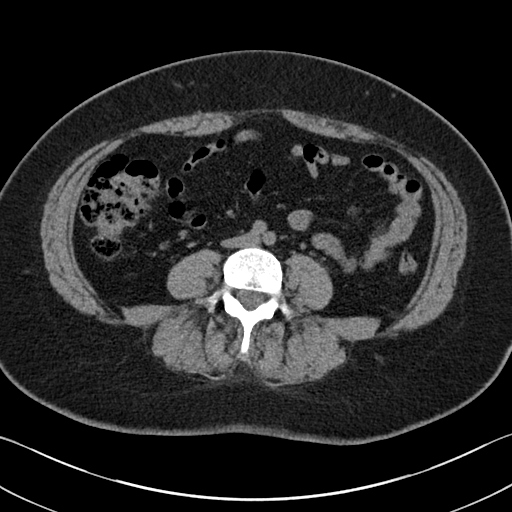
[im 54/100  soft-tissue]
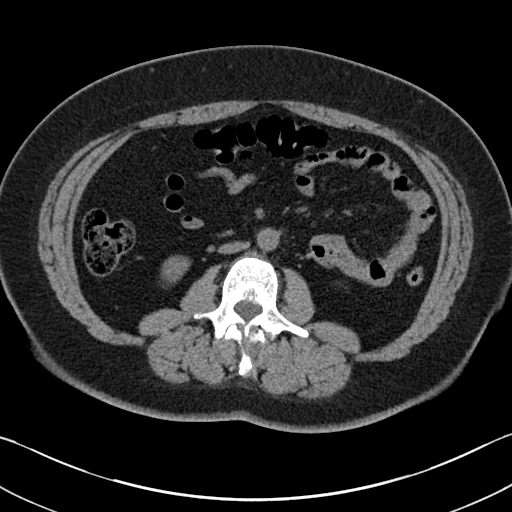
[im 58/100  soft-tissue]
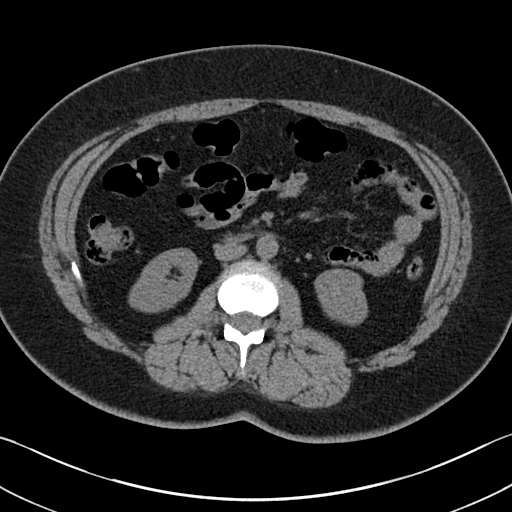
[im 58/100  bone]
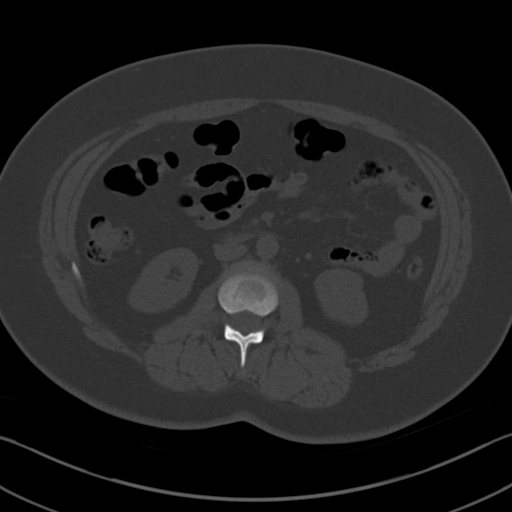
[im 67/100  soft-tissue]
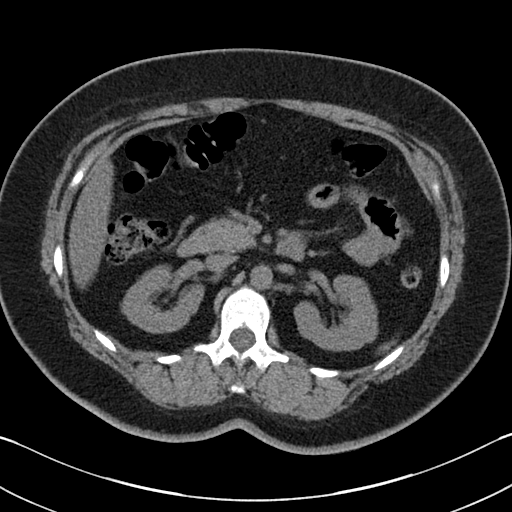
[im 75/100  soft-tissue]
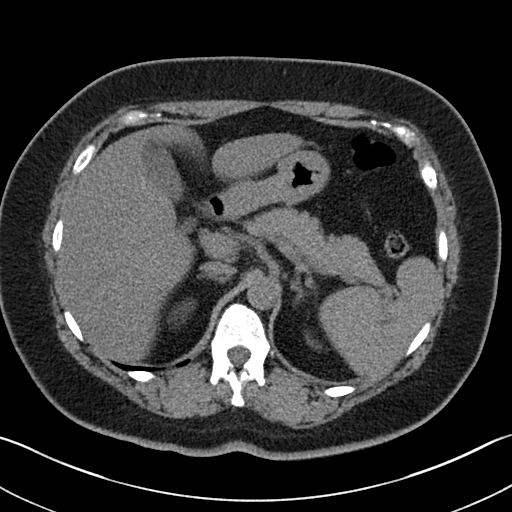
[im 79/100  soft-tissue]
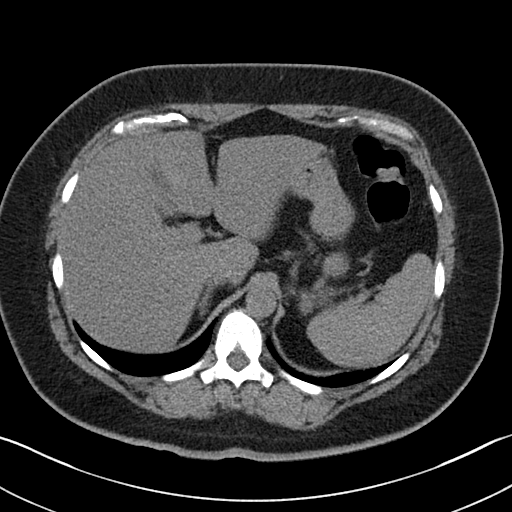
[im 87/100  soft-tissue]
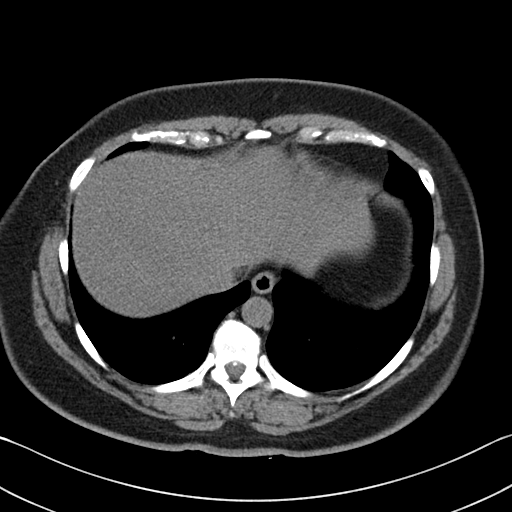
[im 95/100  soft-tissue]
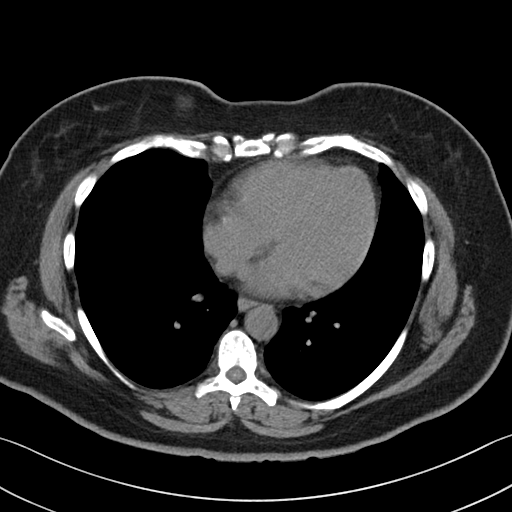

[Series 5: coronal soft tissue · coronal · 0.85mm/px · 3 of 83 slices shown]
[im 28/83  soft-tissue]
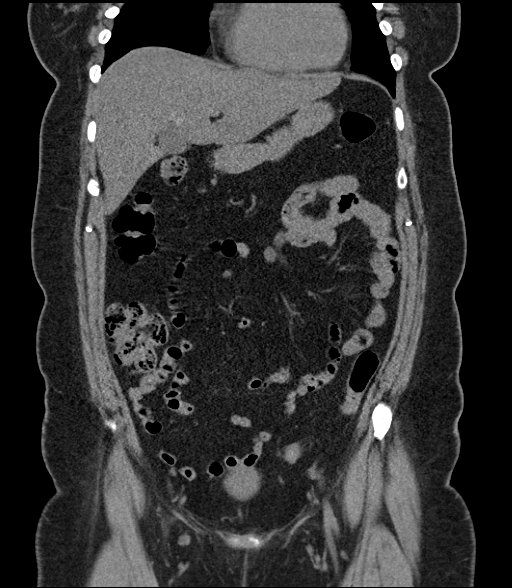
[im 37/83  soft-tissue]
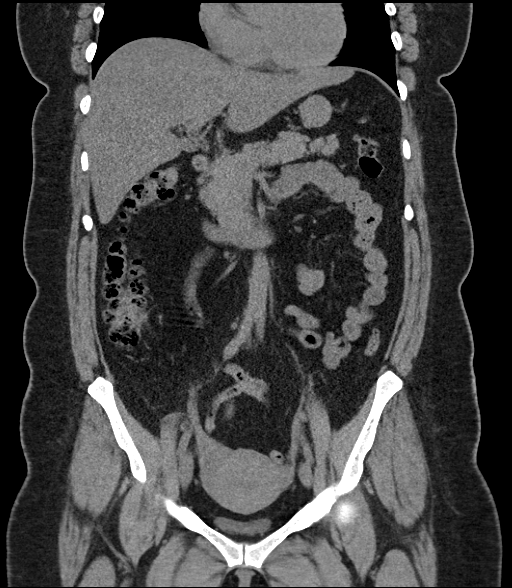
[im 46/83  soft-tissue]
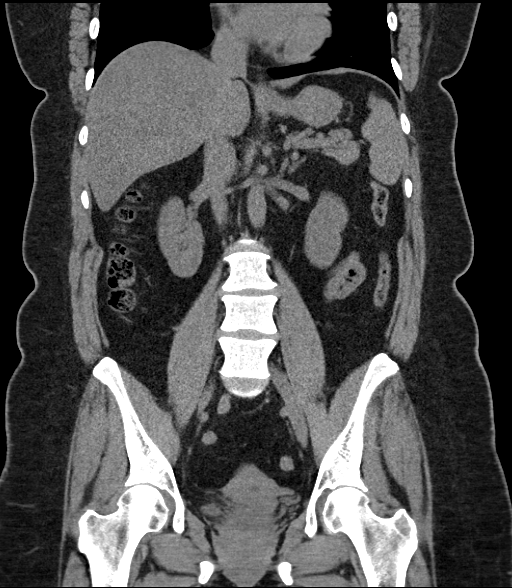

[17 of 46 positions shown; findings below may reference images not displayed]

FINDINGS: Mild motion artifact at the lung bases which appear normal. No
pericardial or pleural effusion.

2 cm dermis and subcutaneous fat associated, fluid density nodule to
the right of the xiphoid probably is a sebaceous cyst or similar
benign entity.

Intermittent disc degeneration in the spine. No acute osseous
abnormality identified.

Numerous pelvic phleboliths. Negative distal colon. Uterus and
adnexa within normal limits. Small amount of gas in the vagina.

Negative left colon, transverse colon and right colon. Normal,
diminutive appendix. Normal terminal ileum. No dilated small bowel.
Decompressed stomach and duodenum.

Negative non contrast liver, gallbladder, spleen, pancreas and
adrenal glands. No abdominal free fluid.

No hydronephrosis or nephrolithiasis. No perinephric stranding. No
hydroureter or or periureteral stranding. The bladder is completely
decompressed. No lymphadenopathy identified.
IMPRESSION: Negative non contrast CT abdomen and pelvis.

## 2015-12-16 ENCOUNTER — Other Ambulatory Visit: Payer: Self-pay | Admitting: Family

## 2015-12-16 DIAGNOSIS — D509 Iron deficiency anemia, unspecified: Secondary | ICD-10-CM

## 2015-12-17 ENCOUNTER — Ambulatory Visit: Payer: BC Managed Care – PPO

## 2015-12-17 ENCOUNTER — Other Ambulatory Visit (HOSPITAL_BASED_OUTPATIENT_CLINIC_OR_DEPARTMENT_OTHER): Payer: BC Managed Care – PPO

## 2015-12-17 ENCOUNTER — Encounter: Payer: Self-pay | Admitting: Family

## 2015-12-17 ENCOUNTER — Other Ambulatory Visit: Payer: BC Managed Care – PPO

## 2015-12-17 ENCOUNTER — Ambulatory Visit (HOSPITAL_BASED_OUTPATIENT_CLINIC_OR_DEPARTMENT_OTHER): Payer: BC Managed Care – PPO

## 2015-12-17 ENCOUNTER — Ambulatory Visit (HOSPITAL_BASED_OUTPATIENT_CLINIC_OR_DEPARTMENT_OTHER): Payer: BC Managed Care – PPO | Admitting: Family

## 2015-12-17 ENCOUNTER — Ambulatory Visit: Payer: BC Managed Care – PPO | Admitting: Family

## 2015-12-17 VITALS — BP 138/87 | HR 71 | Temp 97.8°F | Resp 20

## 2015-12-17 VITALS — BP 135/88 | HR 82 | Temp 98.2°F | Resp 20 | Ht 67.0 in | Wt 212.0 lb

## 2015-12-17 DIAGNOSIS — D509 Iron deficiency anemia, unspecified: Secondary | ICD-10-CM | POA: Diagnosis not present

## 2015-12-17 LAB — CBC WITH DIFFERENTIAL (CANCER CENTER ONLY)
BASO#: 0 10*3/uL (ref 0.0–0.2)
BASO%: 0.7 % (ref 0.0–2.0)
EOS ABS: 0.2 10*3/uL (ref 0.0–0.5)
EOS%: 3.3 % (ref 0.0–7.0)
HEMATOCRIT: 37.9 % (ref 34.8–46.6)
HEMOGLOBIN: 12.3 g/dL (ref 11.6–15.9)
LYMPH#: 1.8 10*3/uL (ref 0.9–3.3)
LYMPH%: 29.2 % (ref 14.0–48.0)
MCH: 25.4 pg — ABNORMAL LOW (ref 26.0–34.0)
MCHC: 32.5 g/dL (ref 32.0–36.0)
MCV: 78 fL — ABNORMAL LOW (ref 81–101)
MONO#: 0.5 10*3/uL (ref 0.1–0.9)
MONO%: 8.8 % (ref 0.0–13.0)
NEUT%: 58 % (ref 39.6–80.0)
NEUTROS ABS: 3.6 10*3/uL (ref 1.5–6.5)
Platelets: 245 10*3/uL (ref 145–400)
RBC: 4.84 10*6/uL (ref 3.70–5.32)
RDW: 15.9 % — ABNORMAL HIGH (ref 11.1–15.7)
WBC: 6.1 10*3/uL (ref 3.9–10.0)

## 2015-12-17 LAB — CHCC SATELLITE - SMEAR

## 2015-12-17 MED ORDER — SODIUM CHLORIDE 0.9 % IV SOLN
510.0000 mg | Freq: Once | INTRAVENOUS | Status: AC
  Administered 2015-12-17: 510 mg via INTRAVENOUS
  Filled 2015-12-17: qty 17

## 2015-12-17 MED ORDER — SODIUM CHLORIDE 0.9 % IV SOLN
Freq: Once | INTRAVENOUS | Status: AC
  Administered 2015-12-17: 15:00:00 via INTRAVENOUS

## 2015-12-17 NOTE — Patient Instructions (Signed)

## 2015-12-17 NOTE — Progress Notes (Signed)
Hematology/Oncology Consultation   Name: Stefanie Brown      MRN: [id number]    Location: Room/bed info not found  Date: 12/17/2015 Time:3:01 PM   REFERRING PHYSICIAN: Megan Salon, MD  REASON FOR CONSULT: Iron deficiency anemia   DIAGNOSIS: Iron deficiency anemia   HISTORY OF PRESENT ILLNESS: Stefanie Brown is a very pleasant 50 yo white female with history of iron deficiency. This was initially thought to be due to menorrhagia. She had a partial hysterectomy in March and her iron studies have remained low.  She is scheduled for a consult with GI Dr. Collene Mares to discuss having a colonoscopy. She has not noticed any dark, tarry stools. No episodes of bleeding or bruising.  Her iron saturation today is 6% with a ferritin of 6. Her Hgb is 12.3 with an MCV of 78.  She is symptomatic with fatigue, weakness, SOB with exertion and palpitations.  She states that her sister also had a history of IDA and took oral iron for a while but is no longer treated.  She is not on an oral iron supplement and has never received IV iron.   No fever, chills, n/v, cough, rash, dizziness, chest pain, abdominal pain or changes in bowel or bladder habits.  She has 1 son. She had a miscarriage 13 years ago. She states the fetus failed to develop.  No swelling, tenderness, numbness or tingling in her extremities. She has chronic lower back pain due to degenerative disc disease of the lumbar spine. No new aches or pains.  She does not smoke or drink alcohol.  She has maintained a good appetite and is staying well hydrated. Her weight is stable.   ROS: All other 10 point review of systems is negative.   PAST MEDICAL HISTORY:   Past Medical History  Diagnosis Date  . Thyroid disease     Hypothyroid  . STD (sexually transmitted disease)     HSV 2  . Hypothyroidism   . Depression   . Anxiety   . GERD (gastroesophageal reflux disease)   . History of hiatal hernia   . Anemia     ALLERGIES: Allergies  Allergen  Reactions  . Sulfamethoxazole-Trimethoprim Hives and Other (See Comments)    Childhood allergy  . Sulfonamide Derivatives Other (See Comments)    Childhood allergy      MEDICATIONS:  Current Outpatient Prescriptions on File Prior to Visit  Medication Sig Dispense Refill  . acetaminophen (TYLENOL) 500 MG tablet Take 1,000 mg by mouth at bedtime as needed for mild pain or moderate pain.    . cetirizine (ZYRTEC) 10 MG tablet Take 10 mg by mouth daily.    Marland Kitchen ibuprofen (ADVIL,MOTRIN) 800 MG tablet Take 1 tablet (800 mg total) by mouth every 8 (eight) hours as needed. 30 tablet 0  . levothyroxine (SYNTHROID, LEVOTHROID) 75 MCG tablet Take 75 mcg by mouth daily before breakfast.     . pantoprazole (PROTONIX) 40 MG tablet Take 40 mg by mouth daily.      No current facility-administered medications on file prior to visit.     PAST SURGICAL HISTORY Past Surgical History  Procedure Laterality Date  . Tubal ligation Bilateral 2001  . Cholecystectomy    . Diagnostic laparoscopy    . Dilation and curettage of uterus    . Laparoscopic hysterectomy N/A 08/31/2015    Procedure: HYSTERECTOMY TOTAL LAPAROSCOPIC;  Surgeon: Megan Salon, MD;  Location: [facility];  Service: Gynecology;  Laterality: N/A;  . Bilateral salpingectomy Bilateral  08/31/2015    Procedure: BILATERAL SALPINGECTOMY;  Surgeon: Megan Salon, MD;  Location: [facility];  Service: Gynecology;  Laterality: Bilateral;  . Cysto N/A 08/31/2015    Procedure: CYSTO;  Surgeon: Megan Salon, MD;  Location: [facility];  Service: Gynecology;  Laterality: N/A;  . Abdominal hysterectomy      FAMILY HISTORY: Family History  Problem Relation Age of Onset  . Parkinson's disease Mother   . Cancer Mother     bladder, bladder removed  . Hypertension Father   . Heart attack Father     SOCIAL HISTORY:  reports that she quit smoking about 3 years ago. Her smoking use included Cigarettes. She has a 12.5 pack-year smoking history. She has never used smokeless  tobacco. She reports that she drinks about 1.8 oz of alcohol per week. She reports that she does not use illicit drugs.  PERFORMANCE STATUS: The patient's performance status is 1 - Symptomatic but completely ambulatory  PHYSICAL EXAM: Most Recent Vital Signs: Blood pressure 135/88, pulse 82, temperature 98.2 F (36.8 C), temperature source Oral, resp. rate 20, height 5\' 7"  (1.702 m), weight 212 lb (96.163 kg), last menstrual period 08/13/2015. BP 135/88 mmHg  Pulse 82  Temp(Src) 98.2 F (36.8 C) (Oral)  Resp 20  Ht 5\' 7"  (1.702 m)  Wt 212 lb (96.163 kg)  BMI 33.20 kg/m2  LMP 08/13/2015 (Exact Date)  General Appearance:    Alert, cooperative, no distress, appears stated age  Head:    Normocephalic, without obvious abnormality, atraumatic  Eyes:    PERRL, conjunctiva/corneas clear, EOM's intact, fundi    benign, both eyes        Throat:   Lips, mucosa, and tongue normal; teeth and gums normal  Neck:   Supple, symmetrical, trachea midline, no adenopathy;    thyroid:  no enlargement/tenderness/nodules; no carotid   bruit or JVD  Back:     Symmetric, no curvature, ROM normal, no CVA tenderness  Lungs:     Clear to auscultation bilaterally, respirations unlabored  Chest Wall:    No tenderness or deformity   Heart:    Regular rate and rhythm, S1 and S2 normal, no murmur, rub   or gallop     Abdomen:     Soft, non-tender, bowel sounds active all four quadrants,    no masses, no organomegaly        Extremities:   Extremities normal, atraumatic, no cyanosis or edema  Pulses:   2+ and symmetric all extremities  Skin:   Skin color, texture, turgor normal, no rashes or lesions  Lymph nodes:   Cervical, supraclavicular, and axillary nodes normal  Neurologic:   CNII-XII intact, normal strength, sensation and reflexes    throughout    LABORATORY DATA:  Results for orders placed or performed in visit on 12/17/15 (from the past 48 hour(s))  CBC w/Diff     Status: Abnormal   Collection  Time: 12/17/15  1:24 PM  Result Value Ref Range   WBC 6.1 3.9 - 10.0 10e3/uL   RBC 4.84 3.70 - 5.32 10e6/uL   HGB 12.3 11.6 - 15.9 g/dL   HCT 37.9 34.8 - 46.6 %   MCV 78 (L) 81 - 101 fL   MCH 25.4 (L) 26.0 - 34.0 pg   MCHC 32.5 32.0 - 36.0 g/dL   RDW 15.9 (H) 11.1 - 15.7 %   Platelets 245 145 - 400 10e3/uL   NEUT# 3.6 1.5 - 6.5 10e3/uL   LYMPH# 1.8 0.9 -  3.3 10e3/uL   MONO# 0.5 0.1 - 0.9 10e3/uL   Eosinophils Absolute 0.2 0.0 - 0.5 10e3/uL   BASO# 0.0 0.0 - 0.2 10e3/uL   NEUT% 58.0 39.6 - 80.0 %   LYMPH% 29.2 14.0 - 48.0 %   MONO% 8.8 0.0 - 13.0 %   EOS% 3.3 0.0 - 7.0 %   BASO% 0.7 0.0 - 2.0 %  Smear     Status: None   Collection Time: 12/17/15  1:24 PM  Result Value Ref Range   Smear Result Smear Available       RADIOGRAPHY: No results found.     PATHOLOGY: None  ASSESSMENT/PLAN: Ms. Fortini is a very pleasant 50 yo white female with history of iron deficiency. She is symptomatic at this time with fatigue, weakness, SOB with exertion and palpitations. Her iron saturation is 6% with a ferritin of 6. Her Hgb is stable at 12.3 with an MCV of 78.  We will give her a dose of Feraheme today and again in 8 days.  She will follow-up with GI and schedule a endo/colonoscopy.  We will plan to see her back in 6 weeks for repeat lab work and follow-up.   All questions were answered. She will contact our office with any problems, questions or concerns. We can certainly see her much sooner if necessary.  She was discussed with and also seen by Dr. Marin Olp and he is in agreement with the aforementioned.   [facility]     Addendum:    I saw and examined the patient with Gleason Ardoin. We will get her blood smear. Despite effective that she really is not anemic, her blood smear does suggest iron deficiency.  Her iron studies are incredibly low. Her ferritin is 6. Her iron saturation is only 6.  We will go ahead and give her some IV iron.  We will have to see what happens with her  hemoglobin with her iron becomes normalized. I suppose that she may have some underlying myeloproliferative disorder. If so, then we should be able to uncover this with iron infusions. Refiling her hemoglobin gets up simply higher, then we may have to consider a JAK2 assay to make sure that she does not have polycythemia.  On her exam I cannot find anything that suggested an underlying hematologic issue. There is no splenomegaly. She had no adenopathy.  She is very nice. We spent about 45 and 45 minutes with her. Her husband was with her.  Will give her a goal doses of iron that we'll get her back to see [place].  Pete E.

## 2015-12-18 ENCOUNTER — Telehealth: Payer: Self-pay | Admitting: Nurse Practitioner

## 2015-12-18 LAB — IRON AND TIBC
%SAT: 6 % — ABNORMAL LOW (ref 21–57)
Iron: 26 ug/dL — ABNORMAL LOW (ref 41–142)
TIBC: 454 ug/dL — AB (ref 236–444)
UIBC: 428 ug/dL — AB (ref 120–384)

## 2015-12-18 LAB — FERRITIN: FERRITIN: 6 ng/mL — AB (ref 9–269)

## 2015-12-18 NOTE — Telephone Encounter (Addendum)
Pt verbalized understanding and appreciation. Appointment has been scheduled. ----- Message from Eliezer Bottom, NP sent at 12/18/2015 11:34 AM EDT ----- Regarding: Iron Iron low! Will need a second dose of Feraheme in 1 week please. Thank you!  Stefanie Brown  ----- Message -----    From: [facility]    Sent: 12/17/2015   1:35 PM      To: Eliezer Bottom, NP

## 2015-12-21 LAB — HEMOGLOBINOPATHY EVALUATION
HEMOGLOBIN A2 QUANTITATION: 1.8 % (ref 0.7–3.1)
HGB C: 0 %
HGB S: 0 %
Hemoglobin F Quantitation: 0 % (ref 0.0–2.0)
Hgb A: 98.2 % — ABNORMAL HIGH (ref 94.0–98.0)

## 2015-12-28 ENCOUNTER — Ambulatory Visit: Payer: BC Managed Care – PPO

## 2015-12-28 NOTE — Progress Notes (Signed)
Pt brought labs from [place] showed to Dr. Marin Olp- will not give Iron today. We scan labs in.

## 2016-01-25 ENCOUNTER — Encounter: Payer: Self-pay | Admitting: Family

## 2016-01-25 ENCOUNTER — Ambulatory Visit (HOSPITAL_BASED_OUTPATIENT_CLINIC_OR_DEPARTMENT_OTHER): Payer: BC Managed Care – PPO | Admitting: Family

## 2016-01-25 ENCOUNTER — Other Ambulatory Visit (HOSPITAL_BASED_OUTPATIENT_CLINIC_OR_DEPARTMENT_OTHER): Payer: BC Managed Care – PPO

## 2016-01-25 VITALS — BP 149/89 | HR 84 | Temp 98.4°F | Resp 16 | Wt 211.0 lb

## 2016-01-25 DIAGNOSIS — N390 Urinary tract infection, site not specified: Secondary | ICD-10-CM

## 2016-01-25 DIAGNOSIS — N920 Excessive and frequent menstruation with regular cycle: Secondary | ICD-10-CM | POA: Diagnosis not present

## 2016-01-25 DIAGNOSIS — D509 Iron deficiency anemia, unspecified: Secondary | ICD-10-CM

## 2016-01-25 DIAGNOSIS — D500 Iron deficiency anemia secondary to blood loss (chronic): Secondary | ICD-10-CM

## 2016-01-25 DIAGNOSIS — M545 Low back pain, unspecified: Secondary | ICD-10-CM

## 2016-01-25 LAB — COMPREHENSIVE METABOLIC PANEL
ALT: 44 U/L (ref 0–55)
AST: 22 U/L (ref 5–34)
Albumin: 3.3 g/dL — ABNORMAL LOW (ref 3.5–5.0)
Alkaline Phosphatase: 65 U/L (ref 40–150)
Anion Gap: 9 mEq/L (ref 3–11)
BUN: 15.1 mg/dL (ref 7.0–26.0)
CHLORIDE: 105 meq/L (ref 98–109)
CO2: 26 mEq/L (ref 22–29)
Calcium: 9.4 mg/dL (ref 8.4–10.4)
Creatinine: 0.8 mg/dL (ref 0.6–1.1)
EGFR: 89 mL/min/{1.73_m2} — AB (ref >90)
GLUCOSE: 137 mg/dL (ref 70–140)
POTASSIUM: 3.7 meq/L (ref 3.5–5.1)
SODIUM: 140 meq/L (ref 136–145)
Total Bilirubin: 0.38 mg/dL (ref 0.20–1.20)
Total Protein: 6.7 g/dL (ref 6.4–8.3)

## 2016-01-25 LAB — CBC WITH DIFFERENTIAL (CANCER CENTER ONLY)
BASO#: 0 10*3/uL (ref 0.0–0.2)
BASO%: 0.4 % (ref 0.0–2.0)
EOS ABS: 0.2 10*3/uL (ref 0.0–0.5)
EOS%: 2.7 % (ref 0.0–7.0)
HCT: 40.5 % (ref 34.8–46.6)
HGB: 13.7 g/dL (ref 11.6–15.9)
LYMPH#: 1.5 10*3/uL (ref 0.9–3.3)
LYMPH%: 27 % (ref 14.0–48.0)
MCH: 28.2 pg (ref 26.0–34.0)
MCHC: 33.8 g/dL (ref 32.0–36.0)
MCV: 83 fL (ref 81–101)
MONO#: 0.3 10*3/uL (ref 0.1–0.9)
MONO%: 5 % (ref 0.0–13.0)
NEUT#: 3.6 10*3/uL (ref 1.5–6.5)
NEUT%: 64.9 % (ref 39.6–80.0)
PLATELETS: 168 10*3/uL (ref 145–400)
RBC: 4.86 10*6/uL (ref 3.70–5.32)
RDW: 18.7 % — ABNORMAL HIGH (ref 11.1–15.7)
WBC: 5.6 10*3/uL (ref 3.9–10.0)

## 2016-01-25 LAB — IRON AND TIBC
%SAT: 27 % (ref 21–57)
IRON: 93 ug/dL (ref 41–142)
TIBC: 348 ug/dL (ref 236–444)
UIBC: 255 ug/dL (ref 120–384)

## 2016-01-25 LAB — URINALYSIS, MICROSCOPIC (CHCC SATELLITE)
BACTERIA UA: NEGATIVE
BILIRUBIN (URINE): NEGATIVE
Glucose: NEGATIVE mg/dL
KETONES: NEGATIVE mg/dL
Leukocyte Esterase: NEGATIVE
Nitrite: NEGATIVE
PH: 7 (ref 4.60–8.00)
Protein: NEGATIVE mg/dL
Specific Gravity, Urine: 1.01 (ref 1.003–1.035)
Urobilinogen, UR: 0.2 mg/dL (ref 0.2–1)
WBC: NEGATIVE (ref 0–2)

## 2016-01-25 LAB — FERRITIN: FERRITIN: 34 ng/mL (ref 9–269)

## 2016-01-25 NOTE — Progress Notes (Signed)
Hematology and Oncology Follow Up Visit  Stefanie Brown [id number] 10/28/1965 50 y.o. 01/25/2016   Principle Diagnosis:  Iron deficiency Anemia   Current Therapy:   IV Iron as indicated - Last received in July      Interim History:  Stefanie Brown is is here today for a follow-up. She received 2 doses of feraheme in July and has had a nice response. The fatigue, SOB and palpitations have all resolved.  No fever, chills, n/v, cough, rash, dizziness, chest pain, abdominal pain or changes in bowel or bladder habits.  She had a colonoscopy last month and had 8 polyps total removed. 3 of these were found to be pre-cancerous. She also had a small hiatal hernia and is now on Protonix 40 mg daily. She will be due for another colonoscopy in 3 years.  No episodes of bleeding or bruising. No lymphadenopathy found on exam.  No swelling or tenderness in her extremities. She has some numbness and tingling in her arms that comes and goes at night.  She is having some pelvic heaviness and pain as well as some lower back discomfort. She has a history of bone spurs at L4-L5.  She has maintained a good appetite and is staying well hydrated. Her weight is stable.  She is staying active going to the gym and swimming several days a week.   Medications:    Medication List       Accurate as of 01/25/16 11:00 AM. Always use your most recent med list.          acetaminophen 500 MG tablet Commonly known as:  TYLENOL Take 1,000 mg by mouth at bedtime as needed for mild pain or moderate pain.   cetirizine 10 MG tablet Commonly known as:  ZYRTEC Take 10 mg by mouth daily.   ibuprofen 800 MG tablet Commonly known as:  ADVIL,MOTRIN Take 1 tablet (800 mg total) by mouth every 8 (eight) hours as needed.   levothyroxine 75 MCG tablet Commonly known as:  SYNTHROID, LEVOTHROID Take 75 mcg by mouth daily before breakfast.   pantoprazole 40 MG tablet Commonly known as:  PROTONIX Take 40 mg by mouth daily.         Allergies:  Allergies  Allergen Reactions  . Sulfamethoxazole-Trimethoprim Hives and Other (See Comments)    Childhood allergy  . Sulfonamide Derivatives Other (See Comments)    Childhood allergy    Past Medical History, Surgical history, Social history, and Family History were reviewed and updated.  Review of Systems: All other 10 point review of systems is negative.   Physical Exam:  weight is 211 lb (95.7 kg). Her oral temperature is 98.4 F (36.9 C). Her blood pressure is 149/89 (abnormal) and her pulse is 84. Her respiration is 16.   Wt Readings from Last 3 Encounters:  01/25/16 211 lb (95.7 kg)  12/17/15 212 lb (96.2 kg)  11/30/15 209 lb (94.8 kg)    Ocular: Sclerae unicteric, pupils equal, round and reactive to light Ear-nose-throat: Oropharynx clear, dentition fair Lymphatic: No cervical supraclavicular or axillary adenopathy Lungs no rales or rhonchi, good excursion bilaterally Heart regular rate and rhythm, no murmur appreciated Abd soft, nontender, positive bowel sounds, no liver or spleen tip palpated on exam  MSK no focal spinal tenderness, no joint edema Neuro: non-focal, well-oriented, appropriate affect Breasts: Deferred  Lab Results  Component Value Date   WBC 5.6 01/25/2016   HGB 13.7 01/25/2016   HCT 40.5 01/25/2016   MCV 83 01/25/2016  PLT 168 01/25/2016   Lab Results  Component Value Date   FERRITIN 6 (L) 12/17/2015   IRON 26 (L) 12/17/2015   TIBC 454 (H) 12/17/2015   UIBC 428 (H) 12/17/2015   IRONPCTSAT 6 (L) 12/17/2015   Lab Results  Component Value Date   RBC 4.86 01/25/2016   No results found for: KPAFRELGTCHN, LAMBDASER, KAPLAMBRATIO No results found for: IGGSERUM, IGA, IGMSERUM No results found for: Ronnald Ramp, A1GS, A2GS, Violet Baldy, MSPIKE, SPEI   Chemistry      Component Value Date/Time   NA 132 (L) 09/01/2015 0602   K 3.6 09/01/2015 0602   CL 101 09/01/2015 0602   CO2 25 09/01/2015 0602    BUN 11 09/01/2015 0602   CREATININE 0.84 09/01/2015 0602      Component Value Date/Time   CALCIUM 7.4 (L) 09/01/2015 0602   ALKPHOS 75 11/09/2013 1827   AST 15 11/09/2013 1827   ALT 22 11/09/2013 1827   BILITOT 0.3 11/09/2013 1827     Impression and Plan: Stefanie Brown is a very pleasant 50 yo white female with history of iron deficiency secondary to menorrhagia. She had a hysterectomy in March of this year and has had a nice response to the two doses of Feraheme she received in July. She [place] asymptomatic at this time.  She is followed closely by GI. Her UA was negative today for UTI. She did have trace blood in her urine. We will get a renal US to further assess for cause of pain.  LFT's look good.  Iron studies have improved. No infusion needed at this time.   We will plan to see her back in 2 months for repeat lab work and follow-up.  She will contact our office with any questions or concerns. We can certainly see her sooner if need be.   Eliezer Bottom, NP 8/14/201711:00 AM

## 2016-02-18 ENCOUNTER — Telehealth: Payer: Self-pay | Admitting: Obstetrics & Gynecology

## 2016-02-18 NOTE — Telephone Encounter (Signed)
Call to patient. Unable to leave a message as voicemail box was full.

## 2016-02-18 NOTE — Telephone Encounter (Signed)
Patient has an update for Dr Sabra Heck she would like to speak with nurse about.

## 2016-02-26 NOTE — Telephone Encounter (Signed)
Second attempt to return patients call, voicemail full, unable to leave message.   Dr. Sabra Heck, ok to close encounter?

## 2016-02-27 NOTE — Telephone Encounter (Signed)
You could also send a letter and just let her know we've tried to reach her but her voicemail is full.  If she has additional concerns/questions, could she please call back.  Ok to close encounter.  Thanks.

## 2016-03-02 ENCOUNTER — Encounter: Payer: Self-pay | Admitting: *Deleted

## 2016-03-02 NOTE — Telephone Encounter (Signed)
Letter sent. OK to close encounter?

## 2016-03-03 ENCOUNTER — Ambulatory Visit (HOSPITAL_BASED_OUTPATIENT_CLINIC_OR_DEPARTMENT_OTHER): Payer: BC Managed Care – PPO

## 2016-03-03 NOTE — Telephone Encounter (Signed)
Encounter close.  Ok to send letter.  Thanks.

## 2016-03-08 ENCOUNTER — Ambulatory Visit (HOSPITAL_BASED_OUTPATIENT_CLINIC_OR_DEPARTMENT_OTHER): Payer: BC Managed Care – PPO

## 2016-03-09 ENCOUNTER — Ambulatory Visit (HOSPITAL_BASED_OUTPATIENT_CLINIC_OR_DEPARTMENT_OTHER): Payer: BC Managed Care – PPO

## 2016-03-17 ENCOUNTER — Telehealth: Payer: Self-pay | Admitting: Obstetrics & Gynecology

## 2016-03-17 ENCOUNTER — Ambulatory Visit (HOSPITAL_BASED_OUTPATIENT_CLINIC_OR_DEPARTMENT_OTHER)
Admission: RE | Admit: 2016-03-17 | Discharge: 2016-03-17 | Disposition: A | Discharge status: Home / Self Care | Payer: BC Managed Care – PPO | Source: Ambulatory Visit | Attending: Family | Admitting: Family

## 2016-03-17 ENCOUNTER — Ambulatory Visit (INDEPENDENT_AMBULATORY_CARE_PROVIDER_SITE_OTHER): Payer: BC Managed Care – PPO | Admitting: Obstetrics & Gynecology

## 2016-03-17 ENCOUNTER — Encounter: Payer: Self-pay | Admitting: Obstetrics & Gynecology

## 2016-03-17 VITALS — BP 118/80 | HR 80 | Resp 14 | Ht 67.0 in | Wt 207.0 lb

## 2016-03-17 DIAGNOSIS — R102 Pelvic and perineal pain: Secondary | ICD-10-CM

## 2016-03-17 DIAGNOSIS — M545 Low back pain, unspecified: Secondary | ICD-10-CM

## 2016-03-17 DIAGNOSIS — N390 Urinary tract infection, site not specified: Secondary | ICD-10-CM | POA: Diagnosis present

## 2016-03-17 DIAGNOSIS — D509 Iron deficiency anemia, unspecified: Secondary | ICD-10-CM | POA: Insufficient documentation

## 2016-03-17 MED ORDER — NYSTATIN 100000 UNIT/GM EX POWD: Freq: Two times a day (BID) | CUTANEOUS | 2 refills | Status: DC

## 2016-03-17 MED ORDER — GABAPENTIN 100 MG PO CAPS: ORAL_CAPSULE | ORAL | 0 refills | Status: DC

## 2016-03-17 NOTE — Telephone Encounter (Signed)
Spoke with patient. Patient had a [place] in March 2017. Patient was referred to Ileana Roup for pelvic PT and feels this did not provide much relief. Repots that over the last two months she has been experiencing internal vaginal pain and feels her skin externally on her vagina is "raw" to the touch. States this has slowly been getting worse. Patient is also having ongoing problems with lower back pain that she feels may correlate to her symptoms. Patient states she has been seen by many physicians for evaluation of her back pain. Denies any urinary symptoms, fever, chills, or sharp pelvic pain. She is scheduled to have a renal ultrasound today in [facility] and would like to be seen by Dr.Miller following that appointment. Appointment scheduled for today at 1 pm with Dr.Miller. Patient is agreeable and verbalizes understanding.  Routing to provider for final review. Patient agreeable to disposition. Will close encounter.

## 2016-03-17 NOTE — Telephone Encounter (Signed)
Left message to call Johnwilliam Shepperson at [phone]. 

## 2016-03-17 NOTE — Progress Notes (Signed)
GYNECOLOGY  VISIT   HPI: 50 y.o. [id number] Married Caucasian female with continued low back pain and low pelvic pain that seem associated.  She did do pelvic PT and does not feel that did very much for her.  She has seen ortho and no surgical recommendations were made.    Pt reports Dr. Marin Olp noted blood in urine on a test done in his office and sent her for renal ultrasound.  This was done today.  Report is in EPIC and reviewed with her personally.    Also, she recently had a colonoscopy and this showed polyps.  This was done by Dr. Collene Mares.  This hasn't changed her pain either.  She wonders if she just needs to "get over it" and "lose this weight".  Pt would like to see a nutritionist.  Advised I do not know anything about the [place], [place], medical community.  States she will ask some friends and if needs referral will let me know.  She is going to investigate possible weight loss programs in her area.  Questions answered.  GYNECOLOGIC HISTORY: Patient's last menstrual period was 08/13/2015 (exact date). Contraception: hysterectomy Menopausal hormone therapy: none  Patient Active Problem List   Diagnosis Date Noted  . H/O: hysterectomy 09/01/2015  . Iron deficiency anemia 08/31/2015  . 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 History of cholecystectomy 06/30/2014  . 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 Lower esophageal ring 05/11/2010  . GERD 04/27/2010  . DYSPHAGIA 04/27/2010  . ABDOMINAL PAIN-EPIGASTRIC 04/27/2010  . 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 Asthma, intermittent 11/18/2009  . Adult hypothyroidism 08/05/2009  . Hypercholesteremia 08/08/2005    Past Medical History:  Diagnosis Date  . Anemia   . Anxiety   . Depression   . GERD (gastroesophageal reflux disease)   . History of hiatal hernia   . Hypothyroidism   . STD (sexually transmitted disease)    HSV 2  . Thyroid disease    Hypothyroid    Past Surgical History:  Procedure Laterality Date  . ABDOMINAL HYSTERECTOMY    .  BILATERAL SALPINGECTOMY Bilateral 08/31/2015   Procedure: BILATERAL SALPINGECTOMY;  Surgeon: Megan Salon, MD;  Location: [facility];  Service: Gynecology;  Laterality: Bilateral;  . CHOLECYSTECTOMY    . CYSTO N/A 08/31/2015   Procedure: CYSTO;  Surgeon: Megan Salon, MD;  Location: [facility];  Service: Gynecology;  Laterality: N/A;  . DIAGNOSTIC LAPAROSCOPY    . DILATION AND CURETTAGE OF UTERUS    . LAPAROSCOPIC HYSTERECTOMY N/A 08/31/2015   Procedure: HYSTERECTOMY TOTAL LAPAROSCOPIC;  Surgeon: Megan Salon, MD;  Location: [facility];  Service: Gynecology;  Laterality: N/A;  . TUBAL LIGATION Bilateral 2001    MEDS:  Reviewed in EPIC and UTD  ALLERGIES: Sulfamethoxazole-trimethoprim and Sulfonamide derivatives  Family History  Problem Relation Age of Onset  . Parkinson's disease Mother   . Cancer Mother     bladder, bladder removed  . Hypertension Father   . Heart attack Father     SH:  Married, non smoker  Review of Systems  Genitourinary:       Pelvic pain associated with low back pain  All other systems reviewed and are negative.   PHYSICAL EXAMINATION:    BP 118/80 (BP Location: Right Arm, Patient Position: Sitting, Cuff Size: Large)   Pulse 80   Resp 14   Ht 5\' 7"  (1.702 m)   Wt 207 lb (93.9 kg)   LMP 08/13/2015 (Exact Date)   BMI 32.42 kg/m  General appearance: alert, cooperative and appears stated age Abdomen: soft, non-tender; bowel sounds normal; no masses,  no organomegaly  Pelvic: External genitalia:  no lesions but skin changes associated with yeast noted              Urethra:  normal appearing urethra with no masses, tenderness or lesions              Bartholins and Skenes: normal                 Vagina: normal appearing vagina with normal color and discharge, no lesions              Cervix: absent              Bimanual Exam:  Uterus:  uterus absent              Adnexa: no mass, fullness, tenderness and significantly decreased pelvic floor pain               Rectovaginal: Yes.  .  Confirms.              Anus:  normal sphincter tone, no lesions  Chaperone was present for exam.  Assessment: Pelvic floor pain and low back pain Obesity Skin yeast  Plan: Nystatin powder BID x 7 days Neurontin 100mg  nightly.  Will try to titrate up and see if this helps her pain at all.  May need to consider pain management.   Pt will let me know about nutritionist referral if this is something she wants help with in the future.

## 2016-03-17 NOTE — Telephone Encounter (Signed)
Patient wants to speak with someone and even possibly be seen today.  She says she is having pain in her lower pelvic area.

## 2016-03-18 ENCOUNTER — Telehealth: Payer: Self-pay | Admitting: *Deleted

## 2016-03-18 NOTE — Telephone Encounter (Addendum)
Patient aware of results  ----- Message from Eliezer Bottom, NP sent at 03/18/2016 10:11 AM EDT ----- Regarding: US Renal US was fine! No abnormalities identified. Thank you!  Sarah  ----- Message ----- From: Buel Ream, Rad Results In Sent: 03/17/2016  11:28 AM To: Eliezer Bottom, NP

## 2016-03-31 ENCOUNTER — Ambulatory Visit: Payer: BC Managed Care – PPO | Admitting: Family

## 2016-03-31 ENCOUNTER — Other Ambulatory Visit: Payer: BC Managed Care – PPO

## 2016-04-04 ENCOUNTER — Telehealth: Payer: Self-pay | Admitting: Obstetrics & Gynecology

## 2016-04-04 DIAGNOSIS — R634 Abnormal weight loss: Secondary | ICD-10-CM

## 2016-04-04 NOTE — Telephone Encounter (Signed)
1. Patient called and said, "My insurance does cover 4 visits to a nutritionist so I'd like to proceed with a referral to one in [place]."  2. "Also the doctor should know that now I am having warming sensations in my upper abdomin right below my right boob. My liver enzymes may be elevated. I don't know but it doesn't feel right."

## 2016-04-04 NOTE — Telephone Encounter (Signed)
Spoke with patient. Patient states insurance does cover 4 visits to nutritionist -would like referral  Per conversation at [place] 03/17/16. Patient states [facility] is closer but would go to [place] "if Dr. Sabra Heck knows a really good one". 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 Patient state she has consumed a few more glasses of wine during this time. Patient states she has had a warm gnawing pain under right breast that is intermittent. Denies the pain in  right breast. Patient states she was diagnosed with ulcer and history of increased LFTs and thinks it is related possibly. Patient currently on Protonix daily. RN asked if she has followed up with Dr. Collene Mares or PCP, patient states "No". Advised patient would review with Dr. Sabra Heck for recommendations and return call. Patient is agreeable.   Dr. Sabra Heck, please advise?

## 2016-04-05 NOTE — Telephone Encounter (Signed)
Would you please call [facility] at [phone] to see if they see patients who are trying to lose weight and have obesity with BMI>30 as diagnosis.  If so, ok to refer there.    Probably should follow up with GI due to the RUQ pain.  Thanks.

## 2016-04-06 NOTE — Telephone Encounter (Signed)
Left message to call Sharee Pimple at [phone].    Calling to update patient. Awaiting return call from [place] for referral. Follow-up with GI for RUQ pain.

## 2016-04-06 NOTE — Telephone Encounter (Signed)
Left message to call Shalandra Leu at [phone].  

## 2016-04-08 NOTE — Telephone Encounter (Signed)
Left message to call Travares Nelles at [phone].  

## 2016-04-11 NOTE — Telephone Encounter (Signed)
Patient returned your call.

## 2016-04-12 NOTE — Telephone Encounter (Signed)
Left message to call Emylee Decelle at [phone].  

## 2016-04-12 NOTE — Telephone Encounter (Signed)
Left message to call Stefanie Brown at [phone].  

## 2016-04-12 NOTE — Telephone Encounter (Signed)
Patient is returning your call.  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

## 2016-04-13 NOTE — Telephone Encounter (Signed)
Spoke with patient and advised of Dr. Sanjuan Dame recommendations as seen below. Advised patient I have contacted [place] multiple times with no return call, will ask Dr. Sabra Heck for recommendations and return call to patient. Patient verbalizes understanding and is agreeable.    Dr. Sabra Heck multiple attempts to [facility] with no return call, do you have any additional recommendations for Nutritionist?

## 2016-04-15 NOTE — Telephone Encounter (Signed)
Referral order placed for Nutritionist for weight loss.   Cc: Theresia Lo   Routing to provider for final review. Patient is agreeable to disposition. Will close encounter.

## 2016-04-15 NOTE — Telephone Encounter (Signed)
Ok to refer to [facility] if she wants to drive that far.  She lives in [place].

## 2016-04-22 ENCOUNTER — Telehealth: Payer: Self-pay | Admitting: Obstetrics & Gynecology

## 2016-04-22 ENCOUNTER — Ambulatory Visit (INDEPENDENT_AMBULATORY_CARE_PROVIDER_SITE_OTHER): Payer: BC Managed Care – PPO | Admitting: Obstetrics & Gynecology

## 2016-04-22 ENCOUNTER — Encounter: Payer: Self-pay | Admitting: Obstetrics & Gynecology

## 2016-04-22 ENCOUNTER — Encounter: Payer: Self-pay | Admitting: Family

## 2016-04-22 ENCOUNTER — Other Ambulatory Visit (HOSPITAL_BASED_OUTPATIENT_CLINIC_OR_DEPARTMENT_OTHER): Payer: BC Managed Care – PPO

## 2016-04-22 ENCOUNTER — Ambulatory Visit (HOSPITAL_BASED_OUTPATIENT_CLINIC_OR_DEPARTMENT_OTHER): Payer: BC Managed Care – PPO | Admitting: Family

## 2016-04-22 VITALS — BP 145/95 | HR 82 | Temp 98.0°F | Wt 203.8 lb

## 2016-04-22 VITALS — BP 118/70 | HR 72 | Resp 16 | Ht 68.0 in | Wt 204.0 lb

## 2016-04-22 DIAGNOSIS — D500 Iron deficiency anemia secondary to blood loss (chronic): Secondary | ICD-10-CM

## 2016-04-22 DIAGNOSIS — K920 Hematemesis: Secondary | ICD-10-CM | POA: Diagnosis not present

## 2016-04-22 DIAGNOSIS — M549 Dorsalgia, unspecified: Secondary | ICD-10-CM | POA: Diagnosis not present

## 2016-04-22 DIAGNOSIS — N939 Abnormal uterine and vaginal bleeding, unspecified: Secondary | ICD-10-CM

## 2016-04-22 DIAGNOSIS — D509 Iron deficiency anemia, unspecified: Secondary | ICD-10-CM

## 2016-04-22 DIAGNOSIS — R102 Pelvic and perineal pain: Secondary | ICD-10-CM

## 2016-04-22 DIAGNOSIS — Z713 Dietary counseling and surveillance: Secondary | ICD-10-CM

## 2016-04-22 DIAGNOSIS — N920 Excessive and frequent menstruation with regular cycle: Secondary | ICD-10-CM | POA: Diagnosis not present

## 2016-04-22 DIAGNOSIS — B372 Candidiasis of skin and nail: Secondary | ICD-10-CM

## 2016-04-22 LAB — COMPREHENSIVE METABOLIC PANEL
ALBUMIN: 3.5 g/dL (ref 3.5–5.0)
ALK PHOS: 85 U/L (ref 40–150)
ALT: 32 U/L (ref 0–55)
ANION GAP: 8 meq/L (ref 3–11)
AST: 18 U/L (ref 5–34)
BUN: 13.7 mg/dL (ref 7.0–26.0)
CALCIUM: 9.5 mg/dL (ref 8.4–10.4)
CO2: 25 mEq/L (ref 22–29)
Chloride: 107 mEq/L (ref 98–109)
Creatinine: 0.8 mg/dL (ref 0.6–1.1)
GLUCOSE: 115 mg/dL (ref 70–140)
POTASSIUM: 4.3 meq/L (ref 3.5–5.1)
SODIUM: 140 meq/L (ref 136–145)
Total Bilirubin: 0.28 mg/dL (ref 0.20–1.20)
Total Protein: 7.1 g/dL (ref 6.4–8.3)

## 2016-04-22 LAB — CBC WITH DIFFERENTIAL (CANCER CENTER ONLY)
BASO#: 0 10*3/uL (ref 0.0–0.2)
BASO%: 0.5 % (ref 0.0–2.0)
EOS ABS: 0.1 10*3/uL (ref 0.0–0.5)
EOS%: 1.8 % (ref 0.0–7.0)
HCT: 43.1 % (ref 34.8–46.6)
HEMOGLOBIN: 15.2 g/dL (ref 11.6–15.9)
LYMPH#: 1.7 10*3/uL (ref 0.9–3.3)
LYMPH%: 25.8 % (ref 14.0–48.0)
MCH: 31 pg (ref 26.0–34.0)
MCHC: 35.3 g/dL (ref 32.0–36.0)
MCV: 88 fL (ref 81–101)
MONO#: 0.3 10*3/uL (ref 0.1–0.9)
MONO%: 5 % (ref 0.0–13.0)
NEUT%: 66.9 % (ref 39.6–80.0)
NEUTROS ABS: 4.4 10*3/uL (ref 1.5–6.5)
Platelets: 218 10*3/uL (ref 145–400)
RBC: 4.9 10*6/uL (ref 3.70–5.32)
RDW: 12.4 % (ref 11.1–15.7)
WBC: 6.6 10*3/uL (ref 3.9–10.0)

## 2016-04-22 MED ORDER — FLUCONAZOLE 200 MG PO TABS: ORAL_TABLET | ORAL | 0 refills | Status: DC

## 2016-04-22 NOTE — Progress Notes (Signed)
GYNECOLOGY  VISIT   HPI:  50 y.o. [id number] Married Caucasian female with complaint of bright red vaginal bleeding that occurred this morning after intercourse.  She was so scared by this.  It wasn't heavy like a period but it did take her a few wipes to remove from skin.  Did not have pain with intercourse.  Denies urinary symptoms or GI changes.  Does have hemorrhoids but does not feel it it related to this.  Reports she continues to have low right sided groin pain.  She's seen ortho, been to pelvic PT, been on Neurontin, and, of course, had a hysterectomy.  She knows, at this point, I just don't know what else to do for this.  GYNECOLOGIC HISTORY: Patient's last menstrual period was 08/13/2015 (exact date). Contraception: hysterectomy Menopausal hormone therapy: none  Patient Active Problem List   Diagnosis Date Noted  . H/O: hysterectomy 09/01/2015  . Iron deficiency anemia 08/31/2015  . 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 History of cholecystectomy 06/30/2014  . 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 Lower esophageal ring 05/11/2010  . GERD 04/27/2010  . DYSPHAGIA 04/27/2010  . ABDOMINAL PAIN-EPIGASTRIC 04/27/2010  . 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 Asthma, intermittent 11/18/2009  . Adult hypothyroidism 08/05/2009  . Hypercholesteremia 08/08/2005    Past Medical History:  Diagnosis Date  . Anemia   . Anxiety   . Depression   . GERD (gastroesophageal reflux disease)   . History of hiatal hernia   . Hypothyroidism   . STD (sexually transmitted disease)    HSV 2  . Thyroid disease    Hypothyroid    Past Surgical History:  Procedure Laterality Date  . ABDOMINAL HYSTERECTOMY    . BILATERAL SALPINGECTOMY Bilateral 08/31/2015   Procedure: BILATERAL SALPINGECTOMY;  Surgeon: Megan Salon, MD;  Location: [facility];  Service: Gynecology;  Laterality: Bilateral;  . CHOLECYSTECTOMY    . CYSTO N/A 08/31/2015   Procedure: CYSTO;  Surgeon: Megan Salon, MD;  Location: [facility];  Service:  Gynecology;  Laterality: N/A;  . DIAGNOSTIC LAPAROSCOPY    . DILATION AND CURETTAGE OF UTERUS    . LAPAROSCOPIC HYSTERECTOMY N/A 08/31/2015   Procedure: HYSTERECTOMY TOTAL LAPAROSCOPIC;  Surgeon: Megan Salon, MD;  Location: [facility];  Service: Gynecology;  Laterality: N/A;  . TUBAL LIGATION Bilateral 2001    MEDS:  Reviewed in EPIC and UTD  ALLERGIES: Sulfamethoxazole-trimethoprim and Sulfonamide derivatives  Family History  Problem Relation Age of Onset  . Parkinson's disease Mother   . Cancer Mother     bladder, bladder removed  . Hypertension Father   . Heart attack Father    SH:  Married, non smoker  Review of Systems  All other systems reviewed and are negative.   PHYSICAL EXAMINATION:    BP 118/70 (BP Location: Right Arm, Patient Position: Sitting, Cuff Size: Normal)   Pulse 72   Resp 16   Ht 5\' 8"  (1.727 m)   Wt 204 lb (92.5 kg)   LMP 08/13/2015 (Exact Date)   BMI 31.02 kg/m     General appearance: alert, cooperative and appears stated age Abdomen: soft, non-tender; bowel sounds normal; no masses,  no organomegaly, erythema beneath skin fold on abdomen consistent with yeast  Pelvic: External genitalia:  no lesions              Urethra:  normal appearing urethra with no masses, tenderness or lesions              Bartholins and Skenes: normal  Vagina: normal appearing vagina with normal color and discharge, no lesions.  Cuff is completely intact without evidence of granulation tissue.  No other vaginal lesions are noted.  Entire cuff is palpated and it non-tender              Cervix: no lesions              Bimanual Exam:  Uterus:  uterus absent              Adnexa: normal adnexa and no mass, fullness, tenderness              Rectovaginal: Yes.  .  Confirms.              Anus:  normal sphincter tone, no lesions, small internal hemorrhoid at 12-1 o'clock, non tender  Chaperone was present for exam.  Assessment: Vaginal bleeding with intercourse in  pt with h/o TLH 08/31/15 No findings on physical exam consistent with bleeding No evidence for bleeding Skin candida  Plan: Pt reassured about exam and findings.  Knows to call if this occurs again. Diflucan 200mg  po x 1, repeat 72 hours for two more doses.  She is using topical nystatin.

## 2016-04-22 NOTE — Progress Notes (Signed)
Hematology and Oncology Follow Up Visit  Stefanie Brown [id number] 1966/05/26 50 y.o. 04/22/2016   Principle Diagnosis:  Iron deficiency anemia   Current Therapy:   IV Iron as indicated - Last received in July 2017    Interim History:  Stefanie Brown is here today for a follow-up. [name] She had a renal US in October which was negative. She would like to try losing weight first and see if this helps. If this does not improve her pain she will agree to have an US of the abdomen. She had a partial hysterectomy in March 2017, ovaries are still in.  She would like a referral to a nutritionist to help her with her diet and weight loss.  She has a good appetite and is staying well hydrated. Her weight is stable.  No fever, chills, n/v, cough, rash, dizziness, chest pain, abdominal pain or changes in bowel or bladder habits.  No episodes of bleeding or bruising. No lymphadenopathy found on exam.  No swelling, tenderness, numbness or tingling in her extremities. No new aches or pains.  She continues to swim and go to the gym for exercise.   Medications:    Medication List       Accurate as of 04/22/16  1:38 PM. Always use your most recent med list.          acetaminophen 500 MG tablet Commonly known as:  TYLENOL Take 1,000 mg by mouth at bedtime as needed for mild pain or moderate pain.   cetirizine 10 MG tablet Commonly known as:  ZYRTEC Take 10 mg by mouth daily.   fluconazole 200 MG tablet Commonly known as:  DIFLUCAN 1 tab po every 3 days for three doses   levothyroxine 75 MCG tablet Commonly known as:  SYNTHROID, LEVOTHROID Take 75 mcg by mouth daily before breakfast.   nystatin powder Commonly known as:  MYCOSTATIN/NYSTOP Apply topically 2 (two) times daily. Apply to affected area for up to 7 days   pantoprazole 40 MG tablet Commonly known as:  PROTONIX Take 40 mg by mouth daily.       Allergies:  Allergies  Allergen  Reactions  . Sulfamethoxazole-Trimethoprim Hives and Other (See Comments)    Childhood allergy  . Sulfonamide Derivatives Other (See Comments)    Childhood allergy    Past Medical History, Surgical history, Social history, and Family History were reviewed and updated.  Review of Systems: All other 10 point review of systems is negative.   Physical Exam:  vitals were not taken for this visit.  Wt Readings from Last 3 Encounters:  04/22/16 204 lb (92.5 kg)  03/17/16 207 lb (93.9 kg)  01/25/16 211 lb (95.7 kg)    Ocular: Sclerae unicteric, pupils equal, round and reactive to light Ear-nose-throat: Oropharynx clear, dentition fair Lymphatic: No cervical supraclavicular or axillary adenopathy Lungs no rales or rhonchi, good excursion bilaterally Heart regular rate and rhythm, no murmur appreciated Abd soft, nontender, positive bowel sounds, no liver or spleen tip palpated on exam  MSK no focal spinal tenderness, no joint edema Neuro: non-focal, well-oriented, appropriate affect Breasts: Deferred  Lab Results  Component Value Date   WBC 6.6 04/22/2016   HGB 15.2 04/22/2016   HCT 43.1 04/22/2016   MCV 88 04/22/2016   PLT 218 04/22/2016   Lab Results  Component Value Date   FERRITIN 34 01/25/2016   IRON 93 01/25/2016   TIBC 348 01/25/2016   UIBC 255 01/25/2016  IRONPCTSAT 27 01/25/2016   Lab Results  Component Value Date   RBC 4.90 04/22/2016   No results found for: KPAFRELGTCHN, LAMBDASER, KAPLAMBRATIO No results found for: IGGSERUM, IGA, IGMSERUM No results found for: Ronnald Ramp, A1GS, A2GS, Tillman Sers, SPEI   Chemistry      Component Value Date/Time   NA 140 01/25/2016 1123   K 3.7 01/25/2016 1123   CL 101 09/01/2015 0602   CO2 26 01/25/2016 1123   BUN 15.1 01/25/2016 1123   CREATININE 0.8 01/25/2016 1123      Component Value Date/Time   CALCIUM 9.4 01/25/2016 1123   ALKPHOS 65 01/25/2016 1123   AST 22 01/25/2016 1123    ALT 44 01/25/2016 1123   BILITOT 0.38 01/25/2016 1123     Impression and Plan: Stefanie Brown is a very pleasant 50 yo white female with history of iron deficiency secondary to menorrhagia. She had a hysterectomy in March of this year and this seems to have resolved her iron deficiency. She is still having mild fatigue at times.  CBC looks good. We will see what her iron studies show and bring her back in next week for an infusion if needed.   She will let us know if the pain persists in the pelvis and lower back after she tries to lose weight and when she is ready to have an abdominal US.  Referral placed for nutritionist.  We will plan to see her back in 6 months, per her request, for repeat lab work and follow-up.  She will contact our office with any questions or concerns. We can certainly see her sooner if need be.   Eliezer Bottom, NP 11/10/20171:38 PM

## 2016-04-22 NOTE — Telephone Encounter (Signed)
Spoke with patient. Patient had a [place] on 08/31/2015. Reports last night after intercourse she had bleeding. Reports bleeding was "more than a spotting, but less than a cycle would be. This is very concerning to me." Denies any current bleeding or pain. Patient is very concerned. Advised she will need to be seen in the office for further evaluation. Patient is agreeable. Appointment scheduled for today 04/22/2016 at 12 pm (date and time per Lamont Snowball, RN).  Routing to provider for final review. Patient agreeable to disposition. Will close encounter.

## 2016-04-25 ENCOUNTER — Telehealth: Payer: Self-pay | Admitting: *Deleted

## 2016-04-25 LAB — IRON AND TIBC
%SAT: 20 % — ABNORMAL LOW (ref 21–57)
Iron: 77 ug/dL (ref 41–142)
TIBC: 384 ug/dL (ref 236–444)
UIBC: 307 ug/dL (ref 120–384)

## 2016-04-25 LAB — FERRITIN: Ferritin: 27 ng/ml (ref 9–269)

## 2016-04-25 NOTE — Telephone Encounter (Addendum)
Patient aware of results. Patient would like to speak to Judson Roch before she schedules appointment. Message given to Judson Roch and [employer].  ----- Message from Eliezer Bottom, NP sent at 04/25/2016 10:05 AM EST ----- Regarding: Iron  Iron saturation still low. Needs one dose of feraheme this week please. Thank you!  Sarah  ----- Message ----- From: Interface, Lab In Three Zero One Sent: 04/22/2016   1:27 PM To: Eliezer Bottom, NP

## 2016-04-26 ENCOUNTER — Ambulatory Visit (HOSPITAL_BASED_OUTPATIENT_CLINIC_OR_DEPARTMENT_OTHER): Payer: BC Managed Care – PPO

## 2016-04-26 VITALS — BP 154/104 | HR 71 | Temp 98.4°F | Resp 17

## 2016-04-26 DIAGNOSIS — N920 Excessive and frequent menstruation with regular cycle: Secondary | ICD-10-CM | POA: Diagnosis not present

## 2016-04-26 DIAGNOSIS — D509 Iron deficiency anemia, unspecified: Secondary | ICD-10-CM

## 2016-04-26 DIAGNOSIS — D500 Iron deficiency anemia secondary to blood loss (chronic): Secondary | ICD-10-CM | POA: Diagnosis not present

## 2016-04-26 MED ORDER — SODIUM CHLORIDE 0.9 % IV SOLN
Freq: Once | INTRAVENOUS | Status: AC
  Administered 2016-04-26: 08:00:00 via INTRAVENOUS

## 2016-04-26 MED ORDER — SODIUM CHLORIDE 0.9 % IV SOLN
510.0000 mg | Freq: Once | INTRAVENOUS | Status: AC
  Administered 2016-04-26: 510 mg via INTRAVENOUS
  Filled 2016-04-26: qty 17

## 2016-04-26 NOTE — Patient Instructions (Signed)
Ferumoxytol injection What is this medicine? FERUMOXYTOL is an iron complex. Iron is used to make healthy red blood cells, which carry oxygen and nutrients throughout the body. This medicine is used to treat iron deficiency anemia in people with chronic kidney disease. COMMON BRAND NAME(S): Feraheme What should I tell my health care provider before I take this medicine? They need to know if you have any of these conditions: -anemia not caused by low iron levels -high levels of iron in the blood -magnetic resonance imaging (MRI) test scheduled -an unusual or allergic reaction to iron, other medicines, foods, dyes, or preservatives -pregnant or trying to get pregnant -breast-feeding How should I use this medicine? This medicine is for injection into a vein. It is given by a health care professional in a hospital or clinic setting. Talk to your pediatrician regarding the use of this medicine in children. Special care may be needed. What if I miss a dose? It is important not to miss your dose. Call your doctor or health care professional if you are unable to keep an appointment. What may interact with this medicine? This medicine may interact with the following medications: -other iron products What should I watch for while using this medicine? Visit your doctor or healthcare professional regularly. Tell your doctor or healthcare professional if your symptoms do not start to get better or if they get worse. You may need blood work done while you are taking this medicine. You may need to follow a special diet. Talk to your doctor. Foods that contain iron include: whole grains/cereals, dried fruits, beans, or peas, leafy green vegetables, and organ meats (liver, kidney). What side effects may I notice from receiving this medicine? Side effects that you should report to your doctor or health care professional as soon as possible: -allergic reactions like skin rash, itching or hives, swelling of the  face, lips, or tongue -breathing problems -changes in blood pressure -feeling faint or lightheaded, falls -fever or chills -flushing, sweating, or hot feelings -swelling of the ankles or feet Side effects that usually do not require medical attention (report to your doctor or health care professional if they continue or are bothersome): -diarrhea -headache -nausea, vomiting -stomach pain Where should I keep my medicine? This drug is given in a hospital or clinic and will not be stored at home.  2017 Elsevier/Gold Standard (2015-07-02 12:41:49)  

## 2016-04-26 NOTE — Progress Notes (Signed)
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  Denies any HTN symptoms. She was encouraged to follow up with PCP regarding chronic high BP readings and educated on the risks of prolonged HTN.

## 2016-09-01 ENCOUNTER — Ambulatory Visit: Payer: BC Managed Care – PPO | Admitting: Hematology & Oncology

## 2016-09-01 ENCOUNTER — Other Ambulatory Visit: Payer: BC Managed Care – PPO

## 2016-09-01 ENCOUNTER — Telehealth: Payer: Self-pay | Admitting: Hematology & Oncology

## 2016-09-01 NOTE — Telephone Encounter (Signed)
Patient called and cx 09/01/16 apt and resch for 09/07/16 and requested to see Judson Roch

## 2016-09-07 ENCOUNTER — Other Ambulatory Visit (HOSPITAL_BASED_OUTPATIENT_CLINIC_OR_DEPARTMENT_OTHER): Payer: BC Managed Care – PPO

## 2016-09-07 ENCOUNTER — Ambulatory Visit (HOSPITAL_BASED_OUTPATIENT_CLINIC_OR_DEPARTMENT_OTHER): Payer: BC Managed Care – PPO | Admitting: Family

## 2016-09-07 ENCOUNTER — Encounter: Payer: Self-pay | Admitting: *Deleted

## 2016-09-07 VITALS — BP 123/80 | HR 75 | Temp 98.3°F | Resp 17 | Wt 205.0 lb

## 2016-09-07 DIAGNOSIS — D508 Other iron deficiency anemias: Secondary | ICD-10-CM

## 2016-09-07 DIAGNOSIS — D509 Iron deficiency anemia, unspecified: Secondary | ICD-10-CM

## 2016-09-07 LAB — COMPREHENSIVE METABOLIC PANEL
ALT: 37 U/L (ref 0–55)
AST: 18 U/L (ref 5–34)
Albumin: 3.5 g/dL (ref 3.5–5.0)
Alkaline Phosphatase: 59 U/L (ref 40–150)
Anion Gap: 8 mEq/L (ref 3–11)
BUN: 13.4 mg/dL (ref 7.0–26.0)
CHLORIDE: 105 meq/L (ref 98–109)
CO2: 27 mEq/L (ref 22–29)
CREATININE: 0.7 mg/dL (ref 0.6–1.1)
Calcium: 9.4 mg/dL (ref 8.4–10.4)
EGFR: >90 mL/min/{1.73_m2} (ref >90)
GLUCOSE: 118 mg/dL (ref 70–140)
POTASSIUM: 3.8 meq/L (ref 3.5–5.1)
SODIUM: 140 meq/L (ref 136–145)
Total Bilirubin: 0.53 mg/dL (ref 0.20–1.20)
Total Protein: 6.6 g/dL (ref 6.4–8.3)

## 2016-09-07 LAB — CBC WITH DIFFERENTIAL (CANCER CENTER ONLY)
BASO#: 0 10*3/uL (ref 0.0–0.2)
BASO%: 0.3 % (ref 0.0–2.0)
EOS%: 2.6 % (ref 0.0–7.0)
Eosinophils Absolute: 0.2 10*3/uL (ref 0.0–0.5)
HEMATOCRIT: 42.1 % (ref 34.8–46.6)
HEMOGLOBIN: 14.7 g/dL (ref 11.6–15.9)
LYMPH#: 1.6 10*3/uL (ref 0.9–3.3)
LYMPH%: 26.1 % (ref 14.0–48.0)
MCH: 32.9 pg (ref 26.0–34.0)
MCHC: 34.9 g/dL (ref 32.0–36.0)
MCV: 94 fL (ref 81–101)
MONO#: 0.4 10*3/uL (ref 0.1–0.9)
MONO%: 6.1 % (ref 0.0–13.0)
NEUT%: 64.9 % (ref 39.6–80.0)
NEUTROS ABS: 4 10*3/uL (ref 1.5–6.5)
Platelets: 167 10*3/uL (ref 145–400)
RBC: 4.47 10*6/uL (ref 3.70–5.32)
RDW: 11.6 % (ref 11.1–15.7)
WBC: 6.2 10*3/uL (ref 3.9–10.0)

## 2016-09-07 LAB — IRON AND TIBC
%SAT: 41 % (ref 21–57)
Iron: 133 ug/dL (ref 41–142)
TIBC: 328 ug/dL (ref 236–444)
UIBC: 195 ug/dL (ref 120–384)

## 2016-09-07 LAB — FERRITIN: Ferritin: 128 ng/ml (ref 9–269)

## 2016-09-07 NOTE — Progress Notes (Signed)
Hematology and Oncology Follow Up Visit  Stefanie Brown [id number] Feb 17, 1966 51 y.o. 09/07/2016   Principle Diagnosis:  Iron deficiency anemia   Current Therapy:   IV Iron as indicated - Last received in November 2017    Interim History:  Stefanie Brown is here today for a follow-up. She is doing well and has no complaints at this time. She recently had the shingles and was treated with Valtrex and resolved quickly. She also had a bout with the flu. Thankfully, she recovered nicely and is staying busy at work. She has some fatigue at times.  No fever, chills, n/v, cough, rash, dizziness, SOB, dizziness, chest pain, abdominal pain or changes in bowel or bladder habits. She takes miralax as needed for constipation.  No episodes of bleeding, bruising or petechiae. No lymphadenopathy found on exam.  No swelling, tenderness, numbness or tingling in her extremities. No new aches or pains.  She has a maintained a good appetite and is staying well hydrated. Her weight is stable.  She continues to swim and goes to the gym for exercise.   Medications:  Allergies as of 09/07/2016      Reactions   Sulfamethoxazole-trimethoprim Hives, Other (See Comments)   Childhood allergy   Sulfonamide Derivatives Other (See Comments)   Childhood allergy      Medication List       Accurate as of 09/07/16 10:49 AM. Always use your most recent med list.          cetirizine 10 MG tablet Commonly known as:  ZYRTEC Take 10 mg by mouth daily.   famotidine 20 MG tablet Commonly known as:  PEPCID Take 20 mg by mouth 2 (two) times daily.   levothyroxine 75 MCG tablet Commonly known as:  SYNTHROID, LEVOTHROID Take 75 mcg by mouth daily before breakfast.   lisinopril 20 MG tablet Commonly known as:  PRINIVIL,ZESTRIL Take 20 mg by mouth daily.       Allergies:  Allergies  Allergen Reactions  . Sulfamethoxazole-Trimethoprim Hives and Other (See Comments)    Childhood allergy  . Sulfonamide  Derivatives Other (See Comments)    Childhood allergy    Past Medical History, Surgical history, Social history, and Family History were reviewed and updated.  Review of Systems: All other 10 point review of systems is negative.   Physical Exam:  weight is 205 lb (93 kg). Her oral temperature is 98.3 F (36.8 C). Her blood pressure is 123/80 and her pulse is 75. Her respiration is 17 and oxygen saturation is 100%.   Wt Readings from Last 3 Encounters:  09/07/16 205 lb (93 kg)  04/22/16 203 lb 12.8 oz (92.4 kg)  04/22/16 204 lb (92.5 kg)    Ocular: Sclerae unicteric, pupils equal, round and reactive to light Ear-nose-throat: Oropharynx clear, dentition fair Lymphatic: No cervical, supraclavicular or axillary adenopathy Lungs no rales or rhonchi, good excursion bilaterally Heart regular rate and rhythm, no murmur appreciated Abd soft, nontender, positive bowel sounds, no liver or spleen tip palpated on exam, no fluid wave MSK no focal spinal tenderness, no joint edema Neuro: non-focal, well-oriented, appropriate affect Breasts: Deferred  Lab Results  Component Value Date   WBC 6.2 09/07/2016   HGB 14.7 09/07/2016   HCT 42.1 09/07/2016   MCV 94 09/07/2016   PLT 167 09/07/2016   Lab Results  Component Value Date   FERRITIN 27 04/22/2016   IRON 77 04/22/2016   TIBC 384 04/22/2016   UIBC 307 04/22/2016   IRONPCTSAT 20 (L)  04/22/2016   Lab Results  Component Value Date   RBC 4.47 09/07/2016   No results found for: KPAFRELGTCHN, LAMBDASER, KAPLAMBRATIO No results found for: IGGSERUM, IGA, IGMSERUM No results found for: Odetta Pink, SPEI   Chemistry      Component Value Date/Time   NA 140 04/22/2016 1319   K 4.3 04/22/2016 1319   CL 101 09/01/2015 0602   CO2 25 04/22/2016 1319   BUN 13.7 04/22/2016 1319   CREATININE 0.8 04/22/2016 1319      Component Value Date/Time   CALCIUM 9.5 04/22/2016 1319   ALKPHOS 85  04/22/2016 1319   AST 18 04/22/2016 1319   ALT 32 04/22/2016 1319   BILITOT 0.28 04/22/2016 1319     Impression and Plan: Stefanie Brown is a very pleasant 51 yo white female with history of iron deficiency. She is doing well and appears to have responded nicely to the IV iron she received in November.  She has some intermittent fatigue at times.  We will see what her iron studies show and bring her back in next week for an infusion if needed.  We will go ahead and plan to see her back in 6 months for repeat lab work and follow-up.  She will contact our office with any questions or concerns. We can certainly see her sooner if need be.   Eliezer Bottom, NP 3/28/201810:49 AM

## 2016-09-28 ENCOUNTER — Ambulatory Visit: Payer: BC Managed Care – PPO | Admitting: Hematology & Oncology

## 2016-09-28 ENCOUNTER — Other Ambulatory Visit: Payer: BC Managed Care – PPO

## 2016-11-03 ENCOUNTER — Other Ambulatory Visit: Payer: BC Managed Care – PPO

## 2016-11-03 ENCOUNTER — Ambulatory Visit: Payer: BC Managed Care – PPO | Admitting: Hematology & Oncology

## 2016-12-15 ENCOUNTER — Encounter: Payer: Self-pay | Admitting: Obstetrics & Gynecology

## 2017-01-06 ENCOUNTER — Ambulatory Visit: Payer: BC Managed Care – PPO | Admitting: Obstetrics & Gynecology

## 2017-01-06 ENCOUNTER — Encounter: Payer: Self-pay | Admitting: Obstetrics & Gynecology

## 2017-01-06 NOTE — Progress Notes (Deleted)
51 y.o. [id number] MarriedCaucasianF here for annual exam.    Patient's last menstrual period was 08/13/2015 (exact date).          Sexually active: {yes no:314532}  The current method of family planning is status post hysterectomy.    Exercising: {yes IR:485462}  {types:19826} Smoker:  Former smoker  Health Maintenance: Pap:  08/13/15  AGUS, HR HPV negative  History of abnormal Pap:  yes MMG:  12/05/16 BIRADS 1 negative  Colonoscopy:  *** BMD:   *** TDaP:  05/08/15  Pneumonia vaccine(s):  *** Zostavax:   *** Hep C testing: not indicated Screening Labs: ***, Hb today: ***, Urine today: ***   reports that she quit smoking about 4 years ago. Her smoking use included Cigarettes. She has a 12.50 pack-year smoking history. She has never used smokeless tobacco. She reports that she drinks about 1.8 oz of alcohol per week . She reports that she does not use drugs.  Past Medical History:  Diagnosis Date  . Anemia   . Anxiety   . Depression   . GERD (gastroesophageal reflux disease)   . History of hiatal hernia   . Hypothyroidism   . STD (sexually transmitted disease)    HSV 2  . Thyroid disease    Hypothyroid    Past Surgical History:  Procedure Laterality Date  . ABDOMINAL HYSTERECTOMY    . BILATERAL SALPINGECTOMY Bilateral 08/31/2015   Procedure: BILATERAL SALPINGECTOMY;  Surgeon: Megan Salon, MD;  Location: [facility];  Service: Gynecology;  Laterality: Bilateral;  . CHOLECYSTECTOMY    . CYSTO N/A 08/31/2015   Procedure: CYSTO;  Surgeon: Megan Salon, MD;  Location: [facility];  Service: Gynecology;  Laterality: N/A;  . DIAGNOSTIC LAPAROSCOPY    . DILATION AND CURETTAGE OF UTERUS    . LAPAROSCOPIC HYSTERECTOMY N/A 08/31/2015   Procedure: HYSTERECTOMY TOTAL LAPAROSCOPIC;  Surgeon: Megan Salon, MD;  Location: [facility];  Service: Gynecology;  Laterality: N/A;  . TUBAL LIGATION Bilateral 2001    Current Outpatient Prescriptions  Medication Sig Dispense Refill  . cetirizine (ZYRTEC) 10  MG tablet Take 10 mg by mouth daily.    . famotidine (PEPCID) 20 MG tablet Take 20 mg by mouth 2 (two) times daily.    Marland Kitchen levothyroxine (SYNTHROID, LEVOTHROID) 75 MCG tablet Take 75 mcg by mouth daily before breakfast.     . lisinopril (PRINIVIL,ZESTRIL) 20 MG tablet Take 20 mg by mouth daily.     No current facility-administered medications for this visit.     Family History  Problem Relation Age of Onset  . Parkinson's disease Mother   . Cancer Mother        bladder, bladder removed  . Hypertension Father   . Heart attack Father     ROS:  Pertinent items are noted in HPI.  Otherwise, a comprehensive ROS was negative.  Exam:   LMP 08/13/2015 (Exact Date)   Weight change: @WEIGHTCHANGE @ Height:      Ht Readings from Last 3 Encounters:  04/22/16 5\' 8"  (1.727 m)  03/17/16 5\' 7"  (1.702 m)  12/17/15 5\' 7"  (1.702 m)    General appearance: alert, cooperative and appears stated age Head: Normocephalic, without obvious abnormality, atraumatic Neck: no adenopathy, supple, symmetrical, trachea midline and thyroid {EXAM; THYROID:18604} Lungs: clear to auscultation bilaterally Breasts: {Exam; breast:13139::"normal appearance, no masses or tenderness"} Heart: regular rate and rhythm Abdomen: soft, non-tender; bowel sounds normal; no masses,  no organomegaly Extremities: extremities normal, atraumatic, no cyanosis or edema Skin: Skin  color, texture, turgor normal. No rashes or lesions Lymph nodes: Cervical, supraclavicular, and axillary nodes normal. No abnormal inguinal nodes palpated Neurologic: Grossly normal   Pelvic: External genitalia:  no lesions              Urethra:  normal appearing urethra with no masses, tenderness or lesions              Bartholins and Skenes: normal                 Vagina: normal appearing vagina with normal color and discharge, no lesions              Cervix: {exam; cervix:14595}              Pap taken: {yes no:314532} Bimanual Exam:  Uterus:  {exam;  uterus:12215}              Adnexa: {exam; adnexa:12223}               Rectovaginal: Confirms               Anus:  normal sphincter tone, no lesions  Chaperone was present for exam.  A:  Well Woman with normal exam  P:   {plan; gyn:5269::"mammogram","pap smear","return annually or prn"}

## 2017-03-08 ENCOUNTER — Other Ambulatory Visit: Payer: BC Managed Care – PPO

## 2017-03-08 ENCOUNTER — Ambulatory Visit: Payer: BC Managed Care – PPO | Admitting: Hematology & Oncology

## 2017-04-11 ENCOUNTER — Encounter: Payer: Self-pay | Admitting: Obstetrics & Gynecology

## 2017-04-11 ENCOUNTER — Ambulatory Visit (HOSPITAL_BASED_OUTPATIENT_CLINIC_OR_DEPARTMENT_OTHER): Payer: BLUE CROSS/BLUE SHIELD | Admitting: Family

## 2017-04-11 ENCOUNTER — Ambulatory Visit (INDEPENDENT_AMBULATORY_CARE_PROVIDER_SITE_OTHER): Payer: BLUE CROSS/BLUE SHIELD | Admitting: Obstetrics & Gynecology

## 2017-04-11 ENCOUNTER — Other Ambulatory Visit (HOSPITAL_BASED_OUTPATIENT_CLINIC_OR_DEPARTMENT_OTHER): Payer: BLUE CROSS/BLUE SHIELD

## 2017-04-11 VITALS — BP 130/86 | HR 74 | Resp 14 | Ht 66.75 in | Wt 203.5 lb

## 2017-04-11 VITALS — BP 124/97 | HR 82 | Temp 98.7°F | Resp 18 | Wt 203.0 lb

## 2017-04-11 DIAGNOSIS — D509 Iron deficiency anemia, unspecified: Secondary | ICD-10-CM

## 2017-04-11 DIAGNOSIS — Z713 Dietary counseling and surveillance: Secondary | ICD-10-CM

## 2017-04-11 DIAGNOSIS — Z01419 Encounter for gynecological examination (general) (routine) without abnormal findings: Secondary | ICD-10-CM

## 2017-04-11 DIAGNOSIS — D508 Other iron deficiency anemias: Secondary | ICD-10-CM

## 2017-04-11 DIAGNOSIS — K5909 Other constipation: Secondary | ICD-10-CM

## 2017-04-11 LAB — COMPREHENSIVE METABOLIC PANEL
ALT: 32 U/L (ref 0–55)
ANION GAP: 10 meq/L (ref 3–11)
AST: 19 U/L (ref 5–34)
Albumin: 3.6 g/dL (ref 3.5–5.0)
Alkaline Phosphatase: 52 U/L (ref 40–150)
BUN: 14.6 mg/dL (ref 7.0–26.0)
CALCIUM: 9.5 mg/dL (ref 8.4–10.4)
CHLORIDE: 105 meq/L (ref 98–109)
CO2: 25 meq/L (ref 22–29)
Creatinine: 0.7 mg/dL (ref 0.6–1.1)
Glucose: 100 mg/dl (ref 70–140)
POTASSIUM: 4.2 meq/L (ref 3.5–5.1)
Sodium: 139 mEq/L (ref 136–145)
Total Bilirubin: 0.45 mg/dL (ref 0.20–1.20)
Total Protein: 6.8 g/dL (ref 6.4–8.3)

## 2017-04-11 LAB — IRON AND TIBC
%SAT: 34 % (ref 21–57)
IRON: 113 ug/dL (ref 41–142)
TIBC: 335 ug/dL (ref 236–444)
UIBC: 222 ug/dL (ref 120–384)

## 2017-04-11 LAB — CBC WITH DIFFERENTIAL (CANCER CENTER ONLY)
BASO#: 0 10*3/uL (ref 0.0–0.2)
BASO%: 0.5 % (ref 0.0–2.0)
EOS%: 2.1 % (ref 0.0–7.0)
Eosinophils Absolute: 0.1 10*3/uL (ref 0.0–0.5)
HCT: 44 % (ref 34.8–46.6)
HGB: 15.5 g/dL (ref 11.6–15.9)
LYMPH#: 1.7 10*3/uL (ref 0.9–3.3)
LYMPH%: 26 % (ref 14.0–48.0)
MCH: 32.4 pg (ref 26.0–34.0)
MCHC: 35.2 g/dL (ref 32.0–36.0)
MCV: 92 fL (ref 81–101)
MONO#: 0.5 10*3/uL (ref 0.1–0.9)
MONO%: 8.2 % (ref 0.0–13.0)
NEUT#: 4 10*3/uL (ref 1.5–6.5)
NEUT%: 63.2 % (ref 39.6–80.0)
PLATELETS: 197 10*3/uL (ref 145–400)
RBC: 4.78 10*6/uL (ref 3.70–5.32)
RDW: 11.7 % (ref 11.1–15.7)
WBC: 6.3 10*3/uL (ref 3.9–10.0)

## 2017-04-11 LAB — FERRITIN: FERRITIN: 119 ng/mL (ref 9–269)

## 2017-04-11 NOTE — Progress Notes (Signed)
51 y.o. G26P1011 Married Caucasian F here for annual exam.  Reports continues to have some RLQ issues.  Has figured out this is constipation.  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  Reports when she has a full bowel movement she has relief from RLQ pain and low back pain.  Saw Scherrie Bateman today.  CBC was normal.  If iron is normal, then will decrease frequency of seeing her.    Going to start volunteering at [employer] and [employer].   Patient's last menstrual period was 08/13/2015 (exact date).          Sexually active: Yes.    The current method of family planning is status post hysterectomy.   Exercising: No.  The patient does not participate in regular exercise at present. Smoker:  Former smoker   Health Maintenance: Pap:  08/13/15 AGUS, HR HPV negative, 02/14/14 negative  History of abnormal Pap:  yes MMG:  12/05/16 BIRADS 1 negative  Colonoscopy:  ~2 years ago with Dr. Collene Mares.  Has follow-up one year.   BMD:   never TDaP:  05/08/15  Pneumonia vaccine(s):  never Zostavax:   never Hep C testing: not indicated  Screening Labs: Dr. Marin Olp, Hb today: same   reports that she quit smoking about 4 years ago. Her smoking use included Cigarettes. She has a 12.50 pack-year smoking history. She has never used smokeless tobacco. She reports that she drinks about 1.8 oz of alcohol per week . She reports that she does not use drugs.  Past Medical History:  Diagnosis Date  . Anemia   . Anxiety   . Depression   . GERD (gastroesophageal reflux disease)   . History of hiatal hernia   . Hypothyroidism   . STD (sexually transmitted disease)    HSV 2  . Thyroid disease    Hypothyroid    Past Surgical History:  Procedure Laterality Date  . BILATERAL SALPINGECTOMY Bilateral 08/31/2015   Procedure: BILATERAL SALPINGECTOMY;  Surgeon: Megan Salon, MD;  Location: [facility];  Service: Gynecology;  Laterality: Bilateral;  . CHOLECYSTECTOMY    . CYSTO N/A 08/31/2015   Procedure: CYSTO;  Surgeon: Megan Salon, MD;  Location: [facility];  Service: Gynecology;  Laterality: N/A;  . DIAGNOSTIC LAPAROSCOPY    . DILATION AND CURETTAGE OF UTERUS    . LAPAROSCOPIC HYSTERECTOMY N/A 08/31/2015   Procedure: HYSTERECTOMY TOTAL LAPAROSCOPIC;  Surgeon: Megan Salon, MD;  Location: [facility];  Service: Gynecology;  Laterality: N/A;  . TUBAL LIGATION Bilateral 2001    Current Outpatient Prescriptions  Medication Sig Dispense Refill  . cetirizine (ZYRTEC) 10 MG tablet Take 10 mg by mouth daily.    . famotidine (PEPCID) 20 MG tablet Take 20 mg by mouth 2 (two) times daily.    Marland Kitchen levothyroxine (SYNTHROID, LEVOTHROID) 75 MCG tablet Take 75 mcg by mouth daily before breakfast.     . lisinopril (PRINIVIL,ZESTRIL) 20 MG tablet Take 20 mg by mouth daily.     No current facility-administered medications for this visit.     Family History  Problem Relation Age of Onset  . Parkinson's disease Mother   . Cancer Mother        bladder, bladder removed  . Hypertension Father   . Heart attack Father     ROS:  Pertinent items are noted in HPI.  Otherwise, a comprehensive ROS was negative.  Exam:   BP 130/86 (BP Location: Right Arm, Patient Position: Sitting, Cuff Size: Normal)  Pulse 74   Resp 14   Ht 5' 6.75" (1.695 m)   Wt 203 lb 8 oz (92.3 kg)   LMP 08/13/2015 (Exact Date)   BMI 32.11 kg/m   Weight:  -1#   Height: 5' 6.75" (169.5 cm)  Ht Readings from Last 3 Encounters:  04/11/17 5' 6.75" (1.695 m)  04/22/16 5\' 8"  (1.727 m)  03/17/16 5\' 7"  (1.702 m)    General appearance: alert, cooperative and appears stated age Head: Normocephalic, without obvious abnormality, atraumatic Neck: no adenopathy, supple, symmetrical, trachea midline and thyroid normal to inspection and palpation Lungs: clear to auscultation bilaterally Breasts: normal appearance, no masses or tenderness Heart: regular rate and rhythm Abdomen: soft, non-tender; bowel sounds normal; no masses,  no  organomegaly Extremities: extremities normal, atraumatic, no cyanosis or edema Skin: Skin color, texture, turgor normal. No rashes or lesions Lymph nodes: Cervical, supraclavicular, and axillary nodes normal. No abnormal inguinal nodes palpated Neurologic: Grossly normal   Pelvic: External genitalia:  no lesions              Urethra:  normal appearing urethra with no masses, tenderness or lesions              Bartholins and Skenes: normal                 Vagina: normal appearing vagina with normal color and discharge, no lesions              Cervix: absent              Pap taken: No. Bimanual Exam:  Uterus:  uterus absent              Adnexa: no mass, fullness, tenderness               Rectovaginal: Confirms               Anus:  normal sphincter tone, no lesions  Chaperone was present for exam.  A:  Well Woman with normal exam GERD Hypothyroidism H/o anemia and low iron, resolved after hysterectomy Colon polyps Hypertension  P:   Mammogram guidelines reviewed pap smear not indicated No lab work obtained today Pt will have flu shot at [facility] as she is going to start volunteering at [employer] Return annually or prn

## 2017-04-11 NOTE — Progress Notes (Signed)
Hematology and Oncology Follow Up Visit  Stefanie Brown [id number] 06/24/1965 52 y.o. 04/11/2017   Principle Diagnosis:  Iron deficiency anemia   Current Therapy:   IV Iron as indicated - Last received in November 2017    Interim History:  Stefanie Brown is here today for follow-up. She is doing well but states she is still having some fatigue and occasional palpitations with over exertion. She attributes this to being "out of shape". She would like to be referred back to nutrition as she never followed up with the first referral.  She would like to lose some weight with exercise and diet changes but has not started yet.  No fever, chills, n/v, cough, rash, dizziness, SOB, chest pain, abdominal pain or changes in bowel or bladder habits.  She still has constipation and takes Mirilax daily.  She is followed by GI and set to have her colonoscopy next year.  No episodes of bleeding, bruising or petechiae. No lymphadenopathy found on exam.  No swelling, tenderness, numbness or tingling in her extremities. No c/o pain.  She has maintained a good appetite and is staying well hydrated. Her weight is stable.   ECOG Performance Status: 0 - Asymptomatic  Medications:  Allergies as of 04/11/2017      Reactions   Sulfamethoxazole-trimethoprim Hives, Other (See Comments)   Childhood allergy   Sulfonamide Derivatives Other (See Comments)   Childhood allergy      Medication List       Accurate as of 04/11/17 11:27 AM. Always use your most recent med list.          cetirizine 10 MG tablet Commonly known as:  ZYRTEC Take 10 mg by mouth daily.   famotidine 20 MG tablet Commonly known as:  PEPCID Take 20 mg by mouth 2 (two) times daily.   levothyroxine 75 MCG tablet Commonly known as:  SYNTHROID, LEVOTHROID Take 75 mcg by mouth daily before breakfast.   lisinopril 20 MG tablet Commonly known as:  PRINIVIL,ZESTRIL Take 20 mg by mouth daily.       Allergies:  Allergies    Allergen Reactions  . Sulfamethoxazole-Trimethoprim Hives and Other (See Comments)    Childhood allergy  . Sulfonamide Derivatives Other (See Comments)    Childhood allergy    Past Medical History, Surgical history, Social history, and Family History were reviewed and updated.  Review of Systems: All other 10 point review of systems is negative.   Physical Exam:  vitals were not taken for this visit.  Wt Readings from Last 3 Encounters:  09/07/16 205 lb (93 kg)  04/22/16 203 lb 12.8 oz (92.4 kg)  04/22/16 204 lb (92.5 kg)    Ocular: Sclerae unicteric, pupils equal, round and reactive to light Ear-nose-throat: Oropharynx clear, dentition fair Lymphatic: No cervical, supraclavicular or axillary adenopathy Lungs no rales or rhonchi, good excursion bilaterally Heart regular rate and rhythm, no murmur appreciated Abd soft, nontender, positive bowel sounds, no liver or spleen tip palpated on exam, no fluid wave  MSK no focal spinal tenderness, no joint edema Neuro: non-focal, well-oriented, appropriate affect Breasts: Deferred   Lab Results  Component Value Date   WBC 6.3 04/11/2017   HGB 15.5 04/11/2017   HCT 44.0 04/11/2017   MCV 92 04/11/2017   PLT 197 04/11/2017   Lab Results  Component Value Date   FERRITIN 128 09/07/2016   IRON 133 09/07/2016   TIBC 328 09/07/2016   UIBC 195 09/07/2016   IRONPCTSAT 41 09/07/2016   Lab Results  Component Value Date   RBC 4.78 04/11/2017   No results found for: KPAFRELGTCHN, LAMBDASER, KAPLAMBRATIO No results found for: IGGSERUM, IGA, IGMSERUM No results found for: Odetta Pink, SPEI   Chemistry      Component Value Date/Time   NA 140 09/07/2016 1019   K 3.8 09/07/2016 1019   CL 101 09/01/2015 0602   CO2 27 09/07/2016 1019   BUN 13.4 09/07/2016 1019   CREATININE 0.7 09/07/2016 1019      Component Value Date/Time   CALCIUM 9.4 09/07/2016 1019   ALKPHOS 59 09/07/2016  1019   AST 18 09/07/2016 1019   ALT 37 09/07/2016 1019   BILITOT 0.53 09/07/2016 1019      Impression and Plan: Stefanie Brown is a very pleasant 51 yo caucasian female with iron deficiency anemia. Her Hgb is stable. She has occasional fatigue and palpitations, with over exertion. This is unchanged.  We will see what her iron studies show and bring her back in later this week for an infusion if needed.  We will go ahead and plan to see her back again in 8 months for repeat lab work and follow-up.  She will contact our office with any questions or concerns. We can certainly see her sooner if need be.   Eliezer Bottom, NP 10/30/201811:27 AM

## 2017-04-25 ENCOUNTER — Encounter: Payer: BLUE CROSS/BLUE SHIELD | Admitting: Nutrition

## 2017-12-07 ENCOUNTER — Inpatient Hospital Stay: Payer: BLUE CROSS/BLUE SHIELD

## 2017-12-07 ENCOUNTER — Inpatient Hospital Stay: Payer: BLUE CROSS/BLUE SHIELD | Attending: Family | Admitting: Family

## 2017-12-07 ENCOUNTER — Other Ambulatory Visit: Payer: Self-pay

## 2017-12-07 ENCOUNTER — Encounter: Payer: Self-pay | Admitting: Family

## 2017-12-07 VITALS — BP 124/86 | HR 70 | Temp 98.5°F | Resp 18 | Wt 201.0 lb

## 2017-12-07 DIAGNOSIS — Z79899 Other long term (current) drug therapy: Secondary | ICD-10-CM | POA: Insufficient documentation

## 2017-12-07 DIAGNOSIS — D508 Other iron deficiency anemias: Secondary | ICD-10-CM

## 2017-12-07 DIAGNOSIS — D509 Iron deficiency anemia, unspecified: Secondary | ICD-10-CM | POA: Diagnosis not present

## 2017-12-07 DIAGNOSIS — K909 Intestinal malabsorption, unspecified: Secondary | ICD-10-CM | POA: Diagnosis not present

## 2017-12-07 LAB — CBC WITH DIFFERENTIAL (CANCER CENTER ONLY)
Basophils Absolute: 0 10*3/uL (ref 0.0–0.1)
Basophils Relative: 0 %
EOS ABS: 0.1 10*3/uL (ref 0.0–0.5)
EOS PCT: 2 %
HCT: 42.9 % (ref 34.8–46.6)
Hemoglobin: 15 g/dL (ref 11.6–15.9)
LYMPHS ABS: 1.7 10*3/uL (ref 0.9–3.3)
Lymphocytes Relative: 25 %
MCH: 31.8 pg (ref 26.0–34.0)
MCHC: 35 g/dL (ref 32.0–36.0)
MCV: 90.9 fL (ref 81.0–101.0)
Monocytes Absolute: 0.5 10*3/uL (ref 0.1–0.9)
Monocytes Relative: 7 %
Neutro Abs: 4.5 10*3/uL (ref 1.5–6.5)
Neutrophils Relative %: 66 %
PLATELETS: 196 10*3/uL (ref 145–400)
RBC: 4.72 MIL/uL (ref 3.70–5.32)
RDW: 12.1 % (ref 11.1–15.7)
WBC: 6.8 10*3/uL (ref 3.9–10.0)

## 2017-12-07 LAB — CMP (CANCER CENTER ONLY)
ALT: 30 U/L (ref 0–44)
AST: 19 U/L (ref 15–41)
Albumin: 3.8 g/dL (ref 3.5–5.0)
Alkaline Phosphatase: 60 U/L (ref 38–126)
Anion gap: 8 (ref 5–15)
BUN: 12 mg/dL (ref 6–20)
CO2: 26 mmol/L (ref 22–32)
Calcium: 9.7 mg/dL (ref 8.9–10.3)
Chloride: 106 mmol/L (ref 98–111)
Creatinine: 0.77 mg/dL (ref 0.44–1.00)
GFR, Est AFR Am: >60 mL/min (ref >60)
GFR, Estimated: >60 mL/min (ref >60)
Glucose, Bld: 99 mg/dL (ref 70–99)
Potassium: 4.3 mmol/L (ref 3.5–5.1)
Sodium: 140 mmol/L (ref 135–145)
Total Bilirubin: 0.6 mg/dL (ref 0.3–1.2)
Total Protein: 7 g/dL (ref 6.5–8.1)

## 2017-12-07 NOTE — Progress Notes (Signed)
Hematology and Oncology Follow Up Visit  Stefanie Brown [id number] Jul 26, 1965 52 y.o. 12/07/2017   Principle Diagnosis:  Iron deficiency anemia secondary to malabsorption   Current Therapy:   IV Iron as indicated - Last received in November2017    Interim History: Stefanie Brown is here today for follow-up. She had stopped taking her Protonix and developed an states that she has developed an ulcer. She has had abdominal pain associated with this.  She saw GI and is not on Dexilant and Linzess. She has had to take an extra zantac a few times in the evening.  She has had no episodes of bleeding, no bruising or petechiae. Hgb is stable at 15.0, MCV 90 and platelets 196.  No fever, chills, n/v, cough, rash, dizziness, SOB, chest pain, palpitations or changes in bladder habits.  She has had some loose stools with the Linzess.  No swelling, tenderness, numbness or tingling in her extremities. No lymphadenopathy noted on her exam.   She is eating well and staying hydrated. Her weight is stable.   ECOG Performance Status: 1 - Symptomatic but completely ambulatory  Medications:  Allergies as of 12/07/2017      Reactions   Sulfa Antibiotics    Sulfamethoxazole-trimethoprim Hives, Other (See Comments)   Childhood allergy   Sulfonamide Derivatives Other (See Comments)   Childhood allergy   Varenicline       Medication List        Accurate as of 12/07/17 11:57 AM. Always use your most recent med list.          cetirizine 10 MG tablet Commonly known as:  ZYRTEC Take 10 mg by mouth daily.   DAILY PROBIOTIC Caps Take by mouth.   dexlansoprazole 60 MG capsule Commonly known as:  DEXILANT Take 60 mg by mouth daily.   levothyroxine 75 MCG tablet Commonly known as:  SYNTHROID, LEVOTHROID Take 75 mcg by mouth daily before breakfast.   linaclotide 145 MCG Caps capsule Commonly known as:  LINZESS Take 145 mcg by mouth daily before breakfast.   lisinopril 20 MG tablet Commonly  known as:  PRINIVIL,ZESTRIL Take 20 mg by mouth daily.       Allergies:  Allergies  Allergen Reactions  . Sulfa Antibiotics   . Sulfamethoxazole-Trimethoprim Hives and Other (See Comments)    Childhood allergy  . Sulfonamide Derivatives Other (See Comments)    Childhood allergy  . Varenicline     Past Medical History, Surgical history, Social history, and Family History were reviewed and updated.  Review of Systems: All other 10 point review of systems is negative.   Physical Exam:  weight is 201 lb (91.2 kg). Her oral temperature is 98.5 F (36.9 C). Her blood pressure is 124/86 and her pulse is 70. Her respiration is 18 and oxygen saturation is 100%.   Wt Readings from Last 3 Encounters:  12/07/17 201 lb (91.2 kg)  04/11/17 203 lb 8 oz (92.3 kg)  04/11/17 203 lb (92.1 kg)    Ocular: Sclerae unicteric, pupils equal, round and reactive to light Ear-nose-throat: Oropharynx clear, dentition fair Lymphatic: No cervical, supraclavicular or axillary adenopathy Lungs no rales or rhonchi, good excursion bilaterally Heart regular rate and rhythm, no murmur appreciated Abd soft, nontender, positive bowel sounds, no liver or spleen tip palpated on exam, no fluid wave  MSK no focal spinal tenderness, no joint edema Neuro: non-focal, well-oriented, appropriate affect Breasts: Deferred   Lab Results  Component Value Date   WBC 6.8 12/07/2017   HGB  15.0 12/07/2017   HCT 42.9 12/07/2017   MCV 90.9 12/07/2017   PLT 196 12/07/2017   Lab Results  Component Value Date   FERRITIN 119 04/11/2017   IRON 113 04/11/2017   TIBC 335 04/11/2017   UIBC 222 04/11/2017   IRONPCTSAT 34 04/11/2017   Lab Results  Component Value Date   RBC 4.72 12/07/2017   No results found for: KPAFRELGTCHN, LAMBDASER, KAPLAMBRATIO No results found for: IGGSERUM, IGA, IGMSERUM No results found for: Odetta Pink, SPEI   Chemistry      Component  Value Date/Time   NA 139 04/11/2017 1102   K 4.2 04/11/2017 1102   CL 101 09/01/2015 0602   CO2 25 04/11/2017 1102   BUN 14.6 04/11/2017 1102   CREATININE 0.7 04/11/2017 1102      Component Value Date/Time   CALCIUM 9.5 04/11/2017 1102   ALKPHOS 52 04/11/2017 1102   AST 19 04/11/2017 1102   ALT 32 04/11/2017 1102   BILITOT 0.45 04/11/2017 1102      Impression and Plan: Stefanie Brown is a very pleasant 52 yo caucasian female with history of iron deficiency secondary to malabsorption. She is being treated for a stomach ulcer at this time with Dexilant and Linzess. She verbalized understanding that she needs to stay on her PPI and not stop abruptly without first discussing with GI. If she becomes iron deficient we will give her an infusion.  We will plan to see her back again in another year for follow-up.  She will contact our office with any questions or concerns. We can certainly see her sooner if need be.   [name], NP 6/27/201911:57 AM

## 2017-12-08 LAB — IRON AND TIBC
Iron: 145 ug/dL (ref 28–170)
SATURATION RATIOS: 40 % — AB (ref 10.4–31.8)
TIBC: 363 ug/dL (ref 250–450)
UIBC: 218 ug/dL

## 2017-12-08 LAB — FERRITIN: FERRITIN: 100 ng/mL (ref 11–307)

## 2018-01-03 ENCOUNTER — Encounter: Payer: Self-pay | Admitting: Obstetrics & Gynecology

## 2018-03-13 ENCOUNTER — Telehealth: Payer: Self-pay | Admitting: Obstetrics & Gynecology

## 2018-03-13 NOTE — Telephone Encounter (Signed)
Spoke with patient. Reports bilateral breast tenderness/soreness for 3-4 wks. Left more tender than right. Denies lumps, nipple d/c or skin changes. No recent injuries. No daily caffeine intake. Hx of hysterectomy, still has ovaries. Screening MMG at [facility] 12/07/17, negative.   Recommended OV for further evaluation. 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 OV scheduled for 10/3 at 4pm with Dr. Sabra Heck. Instructed patient not to press on breast, can take motrin 600 mg qhr prn for pain.   Routing to provider for final review. Patient is agreeable to disposition. Will close encounter.

## 2018-03-13 NOTE — Telephone Encounter (Signed)
Patient has been having breast pain for several weeks. States she does not feel any lumps, has had mammogram this year. If calling tomorrow, patient will be available between 12:30 - 2 PM.

## 2018-03-15 ENCOUNTER — Ambulatory Visit (INDEPENDENT_AMBULATORY_CARE_PROVIDER_SITE_OTHER): Payer: BLUE CROSS/BLUE SHIELD | Admitting: Obstetrics & Gynecology

## 2018-03-15 ENCOUNTER — Encounter: Payer: Self-pay | Admitting: Obstetrics & Gynecology

## 2018-03-15 VITALS — BP 112/76 | HR 76 | Resp 14 | Ht 66.75 in | Wt 202.2 lb

## 2018-03-15 DIAGNOSIS — N649 Disorder of breast, unspecified: Secondary | ICD-10-CM

## 2018-03-15 DIAGNOSIS — H539 Unspecified visual disturbance: Secondary | ICD-10-CM | POA: Diagnosis not present

## 2018-03-15 DIAGNOSIS — N644 Mastodynia: Secondary | ICD-10-CM

## 2018-03-15 DIAGNOSIS — N6081 Other benign mammary dysplasias of right breast: Secondary | ICD-10-CM

## 2018-03-15 DIAGNOSIS — T730XXA Starvation, initial encounter: Secondary | ICD-10-CM

## 2018-03-15 NOTE — Patient Instructions (Signed)
200 IU Vit E

## 2018-03-15 NOTE — Progress Notes (Signed)
GYNECOLOGY  VISIT  CC:   Breast mass, breast tenderness  HPI: 52 y.o. G67P1011 Married White or Caucasian female here for bilateral breast tenderness.  Has a breast nodule that she's noted for 30+ years.  She feels this has gotten larger.  She's also noted tenderness of both breasts that have been tender.  She does have some caffeine each days.  Does not drink diet drinks.  Makes decaf iced tea at home.  This is cyclical.  This month, this seems to be lasting much longer.  She's also noted more axillary itching as well.  Last MMG was 12/07/17.  This was negative and she did a 3D.    Lastly, has some questions about diabetes.  Feels like she is having early am hypoglycemia.  Is not sure she's been tested for diabetes and would like to be.  Recent lab work reviewed.  Glucose values have been between near 100 and slightly above.  Also noted recent ophthalmologist visit where she was informed significant change in her eyes.  Wonders if this is related as well.  GYNECOLOGIC HISTORY: Patient's last menstrual period was 08/13/2015 (exact date). Contraception: hysterectomy  Menopausal hormone therapy: none  Patient Active Problem List   Diagnosis Date Noted  . H/O: hysterectomy 09/01/2015  . Iron deficiency anemia 08/31/2015  . 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 History of cholecystectomy 06/30/2014  . 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 Lower esophageal ring 05/11/2010  . GERD 04/27/2010  . DYSPHAGIA 04/27/2010  . ABDOMINAL PAIN-EPIGASTRIC 04/27/2010  . 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 Asthma, intermittent 11/18/2009  . Adult hypothyroidism 08/05/2009  . Hypercholesteremia 08/08/2005    Past Medical History:  Diagnosis Date  . Anxiety   . Depression   . GERD (gastroesophageal reflux disease)   . History of hiatal hernia   . Hypothyroidism   . STD (sexually transmitted disease)    HSV 2    Past Surgical History:  Procedure Laterality Date  . BILATERAL SALPINGECTOMY Bilateral 08/31/2015   Procedure: BILATERAL SALPINGECTOMY;  Surgeon: Megan Salon, MD;  Location: [facility];  Service: Gynecology;  Laterality: Bilateral;  . CHOLECYSTECTOMY  2016  . CYSTO N/A 08/31/2015   Procedure: CYSTO;  Surgeon: Megan Salon, MD;  Location: [facility];  Service: Gynecology;  Laterality: N/A;  . DIAGNOSTIC LAPAROSCOPY    . DILATION AND CURETTAGE OF UTERUS    . LAPAROSCOPIC HYSTERECTOMY N/A 08/31/2015   Procedure: HYSTERECTOMY TOTAL LAPAROSCOPIC;  Surgeon: Megan Salon, MD;  Location: [facility];  Service: Gynecology;  Laterality: N/A;  . TUBAL LIGATION Bilateral 2001    MEDS:   Current Outpatient Medications on File Prior to Visit  Medication Sig Dispense Refill  . cetirizine (ZYRTEC) 10 MG tablet Take 10 mg by mouth daily.    Marland Kitchen dexlansoprazole (DEXILANT) 60 MG capsule Take 60 mg by mouth daily.    Marland Kitchen levothyroxine (SYNTHROID, LEVOTHROID) 75 MCG tablet Take 75 mcg by mouth daily before breakfast.     . linaclotide (LINZESS) 145 MCG CAPS capsule Take 145 mcg by mouth daily before breakfast.    . lisinopril (PRINIVIL,ZESTRIL) 20 MG tablet Take 20 mg by mouth daily.    . Probiotic Product (DAILY PROBIOTIC) CAPS Take by mouth.     No current facility-administered medications on file prior to visit.     ALLERGIES: Sulfa antibiotics; Sulfamethoxazole-trimethoprim; Sulfonamide derivatives; and Varenicline  Family History  Problem Relation Age of Onset  . Parkinson's disease Mother   . Cancer Mother  bladder, bladder removed  . Hypertension Father   . Heart attack Father     SH:  Married, non smoker  Review of Systems  Genitourinary:       Breast pain  Breast cyst discharge   Musculoskeletal: Positive for myalgias.  All other systems reviewed and are negative.   PHYSICAL EXAMINATION:    BP 112/76 (BP Location: Right Arm, Patient Position: Sitting, Cuff Size: Large)   Pulse 76   Resp 14   Ht 5' 6.75" (1.695 m)   Wt 202 lb 3.2 oz (91.7 kg)   LMP 08/13/2015 (Exact Date)   BMI 31.91  kg/m     Physical Exam  Constitutional: She is oriented to person, place, and time. She appears well-developed and well-nourished.  Neck: Normal range of motion. Neck supple.  Respiratory: Right breast exhibits mass and tenderness. Right breast exhibits no inverted nipple, no nipple discharge and no skin change. Left breast exhibits tenderness. Left breast exhibits no inverted nipple, no mass, no nipple discharge and no skin change. Breasts are symmetrical.    Lymphadenopathy:    She has no cervical adenopathy.  Neurological: She is alert and oriented to person, place, and time.  Skin: Skin is warm and dry.  Psychiatric: She has a normal mood and affect.   Chaperone was present for exam.  Assessment: 3.7cm sebaceous cyst Bilateral breast tenderness, normal exam Desires diabetes testing  Plan: Nothing on physical exam warrants repeating imaging.  Decreasing caffeine and Vit E 200IU daily discussed for possible improvement in breast tenderness Offered referral to breast surgeon for removal of sebaceous cyst.  She will consider this. HbA1C obtained today.   ~15 minutes spent with patient >50% of time was in face to face discussion of above.

## 2018-03-16 LAB — HEMOGLOBIN A1C
ESTIMATED AVERAGE GLUCOSE: 108 mg/dL
HEMOGLOBIN A1C: 5.4 % (ref 4.8–5.6)

## 2018-03-20 ENCOUNTER — Telehealth: Payer: Self-pay | Admitting: Obstetrics & Gynecology

## 2018-03-20 NOTE — Telephone Encounter (Signed)
Returned call. Pt read mychart messgae right after calling our office. Questions answered.  Encounter closed.

## 2018-03-20 NOTE — Telephone Encounter (Signed)
Patient is calling asking for her recent A1c results.

## 2018-03-20 NOTE — Telephone Encounter (Signed)
Viewed by Norton Blizzard on 03/20/2018 4:19 PM  Written by Megan Salon, MD on 03/16/2018 5:27 PM  Mrs. Bonus,  Your hemoglobin A1C test (the diabetes test) was 5.4. This is completley normal and not even pre-diabetes. That's great. You may want to pay attention to how much protein you are getting with dinner to help maintain blood sugar levels longer. Also, you could consider getting a glucometer to test your blood sugars when you get up at night just to see if the level is dropping and how much. Just a thought. Let me know if I can help with any of this. Thanks.   Edwinna Areola

## 2018-06-26 ENCOUNTER — Encounter: Payer: Self-pay | Admitting: Obstetrics & Gynecology

## 2018-06-26 ENCOUNTER — Ambulatory Visit (INDEPENDENT_AMBULATORY_CARE_PROVIDER_SITE_OTHER): Payer: BLUE CROSS/BLUE SHIELD | Admitting: Obstetrics & Gynecology

## 2018-06-26 VITALS — BP 130/90 | HR 88 | Resp 16 | Ht 66.75 in | Wt 204.0 lb

## 2018-06-26 DIAGNOSIS — Z01419 Encounter for gynecological examination (general) (routine) without abnormal findings: Secondary | ICD-10-CM | POA: Diagnosis not present

## 2018-06-26 MED ORDER — NYSTATIN 100000 UNIT/GM EX POWD: Freq: Two times a day (BID) | CUTANEOUS | 2 refills | Status: DC

## 2018-06-26 NOTE — Progress Notes (Signed)
53 y.o. G75P1011 Married White or Caucasian female here for annual exam.  Patient states that she fell a few minutes before arriving at our office in a boutique and was unable to catch herself causing her to hit her face and head on the floor.    Denies vaginal bleeding.  Had flu shot today.  Considering getting the flu shot.    Had blood work done with Dr. Brynda Greathouse.   Colonoscopy is due this summer.  Had 8 polyps.  Was prescribed Dexilant and Linzess.  Had some samples.    She is still having some low back pain and LLQ pain.  Has seen pelvic PT.  Patient's last menstrual period was 08/13/2015 (exact date).          Sexually active: Yes.    The current method of family planning is status post hysterectomy.    Exercising: no  The patient does not participate in regular exercise at present. Smoker:  no  Health Maintenance: Pap:  08/13/15 AGUS. HR HPV:Neg History of abnormal Pap:  yes MMG:  12/07/17 BIRADS1:neg  Colonoscopy:  12/30/15 polyps.  Due this summer.  BMD:   no TDaP:  2016 Pneumonia vaccine(s):  no Shingrix:   no Hep C testing: no  Screening Labs: PCP   reports that she quit smoking about 6 years ago. Her smoking use included cigarettes. She has a 12.50 pack-year smoking history. She has never used smokeless tobacco. She reports current alcohol use of about 3.0 standard drinks of alcohol per week. She reports that she does not use drugs.  Past Medical History:  Diagnosis Date  . Anxiety   . Depression   . GERD (gastroesophageal reflux disease)   . History of hiatal hernia   . Hypothyroidism   . STD (sexually transmitted disease)    HSV 2    Past Surgical History:  Procedure Laterality Date  . BILATERAL SALPINGECTOMY Bilateral 08/31/2015   Procedure: BILATERAL SALPINGECTOMY;  Surgeon: Megan Salon, MD;  Location: [facility];  Service: Gynecology;  Laterality: Bilateral;  . CHOLECYSTECTOMY  2016  . CYSTO N/A 08/31/2015   Procedure: CYSTO;  Surgeon: Megan Salon, MD;  Location:  [facility];  Service: Gynecology;  Laterality: N/A;  . DIAGNOSTIC LAPAROSCOPY    . DILATION AND CURETTAGE OF UTERUS    . LAPAROSCOPIC HYSTERECTOMY N/A 08/31/2015   Procedure: HYSTERECTOMY TOTAL LAPAROSCOPIC;  Surgeon: Megan Salon, MD;  Location: [facility];  Service: Gynecology;  Laterality: N/A;  . TUBAL LIGATION Bilateral 2001    Current Outpatient Medications  Medication Sig Dispense Refill  . cetirizine (ZYRTEC) 10 MG tablet Take 10 mg by mouth daily.    Marland Kitchen dexlansoprazole (DEXILANT) 60 MG capsule Take 60 mg by mouth daily.    Marland Kitchen levothyroxine (SYNTHROID, LEVOTHROID) 75 MCG tablet Take 75 mcg by mouth daily before breakfast.     . linaclotide (LINZESS) 145 MCG CAPS capsule Take 145 mcg by mouth daily before breakfast.    . lisinopril (PRINIVIL,ZESTRIL) 20 MG tablet Take 20 mg by mouth daily.    . Probiotic Product (DAILY PROBIOTIC) CAPS Take by mouth.     No current facility-administered medications for this visit.     Family History  Problem Relation Age of Onset  . Parkinson's disease Mother   . Cancer Mother        bladder, bladder removed  . Hypertension Father   . Heart attack Father     Review of Systems  All other systems reviewed and are  negative.   Exam:   BP 130/90   Pulse 88   Resp 16   Ht 5' 6.75" (1.695 m)   Wt 204 lb (92.5 kg)   LMP 08/13/2015 (Exact Date)   BMI 32.19 kg/m   Height: 5' 6.75" (169.5 cm)  Ht Readings from Last 3 Encounters:  06/26/18 5' 6.75" (1.695 m)  03/15/18 5' 6.75" (1.695 m)  04/11/17 5' 6.75" (1.695 m)    General appearance: alert, cooperative and appears stated age Head: Normocephalic, without obvious abnormality, atraumatic Neck: no adenopathy, supple, symmetrical, trachea midline and thyroid normal to inspection and palpation Lungs: clear to auscultation bilaterally Breasts: normal appearance, no masses or tenderness Heart: regular rate and rhythm Abdomen: soft, non-tender; bowel sounds normal; no masses,  no  organomegaly Extremities: extremities normal, atraumatic, no cyanosis or edema Skin: Skin color, texture, turgor normal. No rashes or lesions Lymph nodes: Cervical, supraclavicular, and axillary nodes normal. No abnormal inguinal nodes palpated Physical Exam  Constitutional: She is oriented to person, place, and time.  Neurological: She is alert and oriented to person, place, and time. She has normal sensation, normal strength, normal reflexes and intact cranial nerves. Gait normal.   Pelvic: External genitalia:  no lesions              Urethra:  normal appearing urethra with no masses, tenderness or lesions              Bartholins and Skenes: normal                 Vagina: normal appearing vagina with normal color and discharge, no lesions              Cervix: absent              Pap taken: No. Bimanual Exam:  Uterus:  uterus absent              Adnexa: normal adnexa and no mass, fullness, tenderness               Rectovaginal: Confirms               Anus:  normal sphincter tone, no lesions  Chaperone was present for exam.  A:  Well Woman with normal exam H/o GERD Hypothyroidism H/O anemia with low iron which resolved after hysterectomy H/o colon polyps Hypertension Fall earlier today with normal neurological exam now Continued LLQ pain, no worse  P:   Mammogram guidelines reviewed pap smear not indicated Pt is going to go by Dr. Lorie Apley office today (communicated with her while pt was in the office) for samples or change in medication due ot cost concerns Shingrix vaccination discussed RF for nystatin cream BID up to 7 days.  Rx to pharmacy. return annually or prn

## 2018-07-10 IMAGING — US US RENAL
1 series · 14 of 25 positions shown · non-contrast
Comparison: None.

CLINICAL DATA: Chronic bilateral low back pain, microhematuria.

EXAM:
RENAL / URINARY TRACT ULTRASOUND COMPLETE

[Series 1: us renal · 0.25mm/px · 14 of 26 slices shown]
[im 1/26]
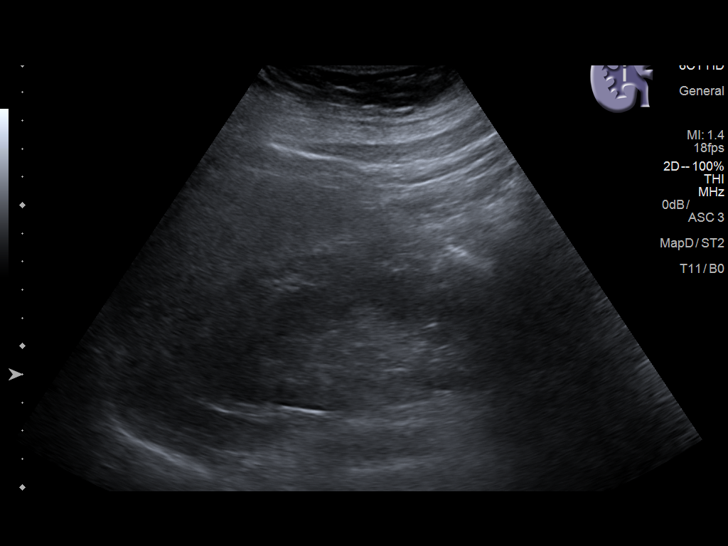
[im 3/26]
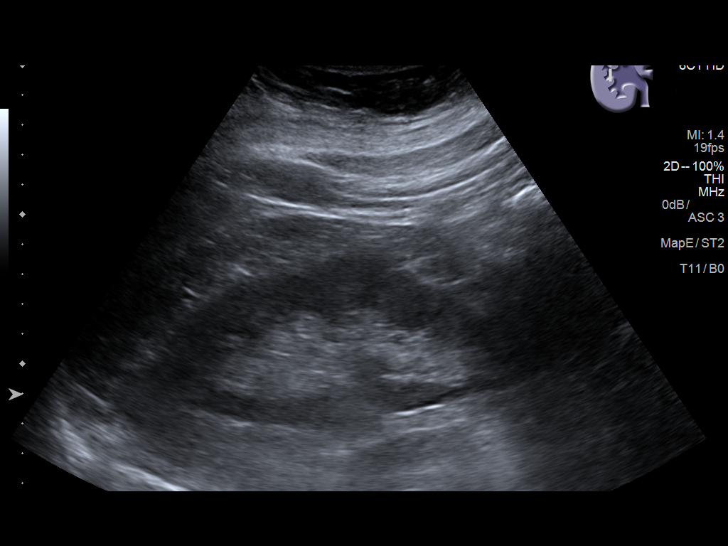
[im 5/26]
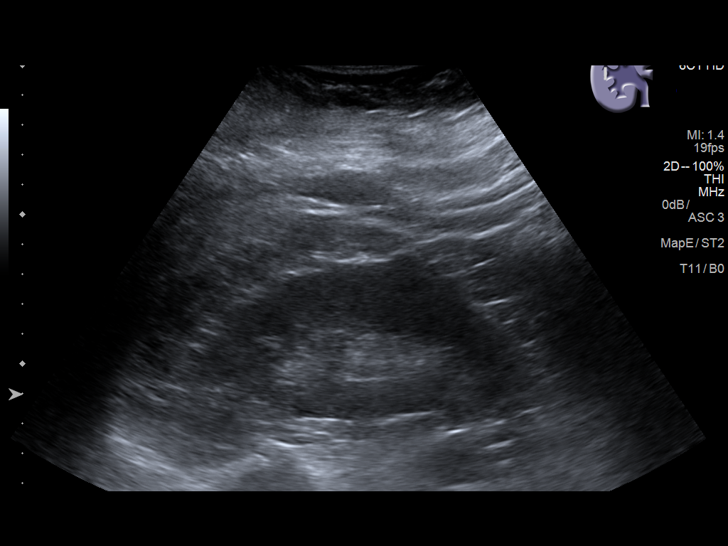
[im 7/26]
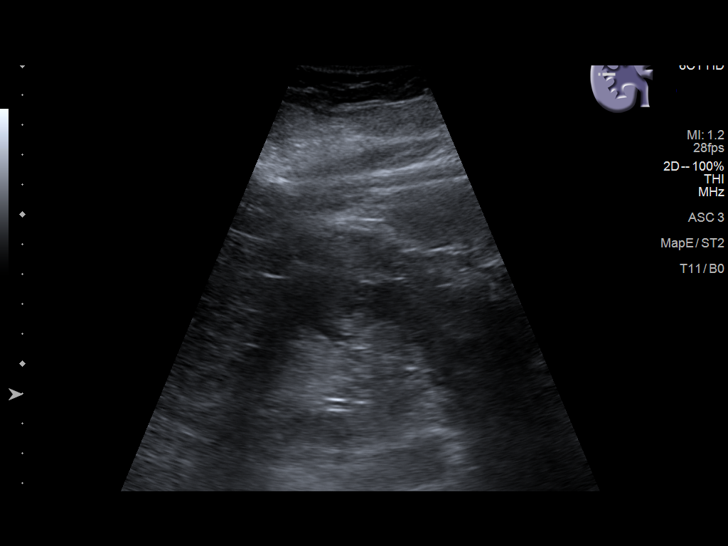
[im 9/26]
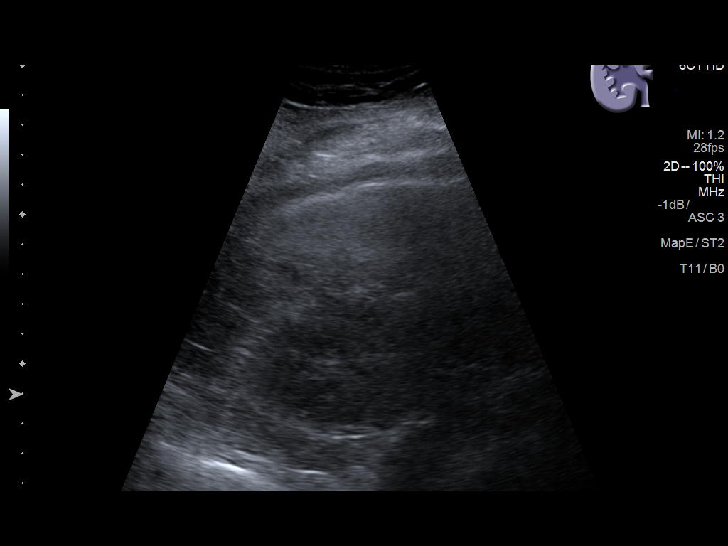
[im 10/26]
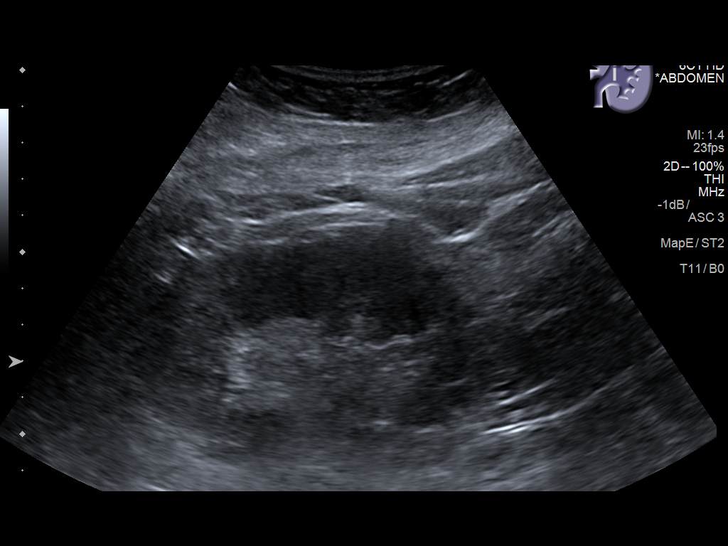
[im 12/26]
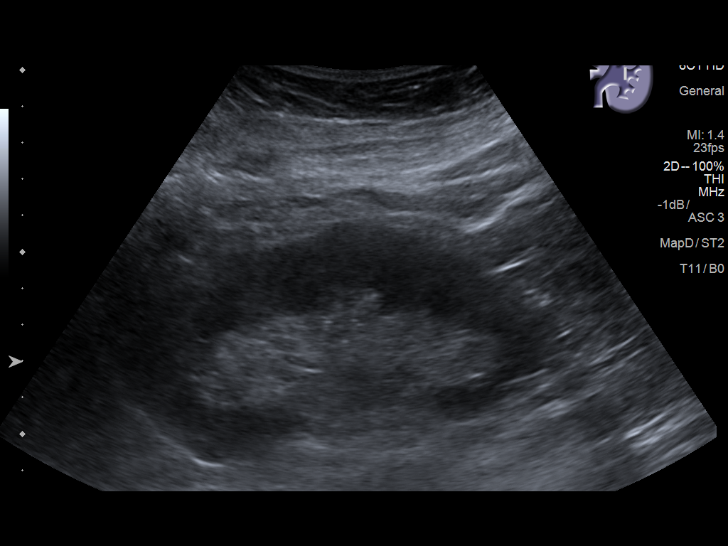
[im 14/26]
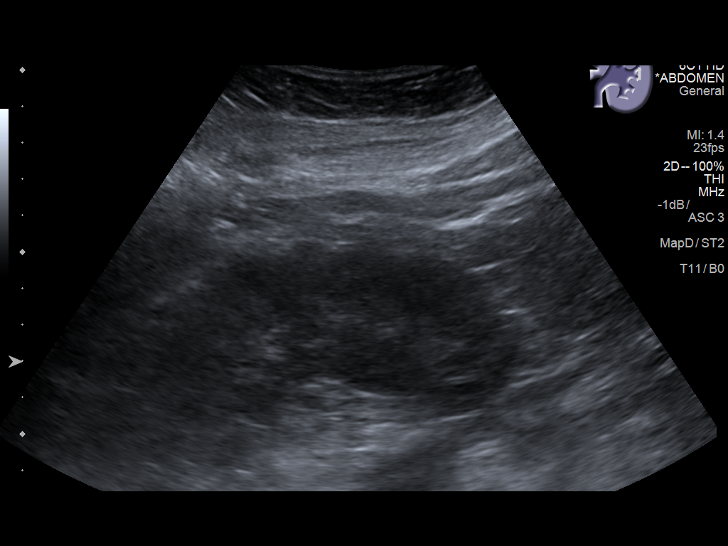
[im 16/26]
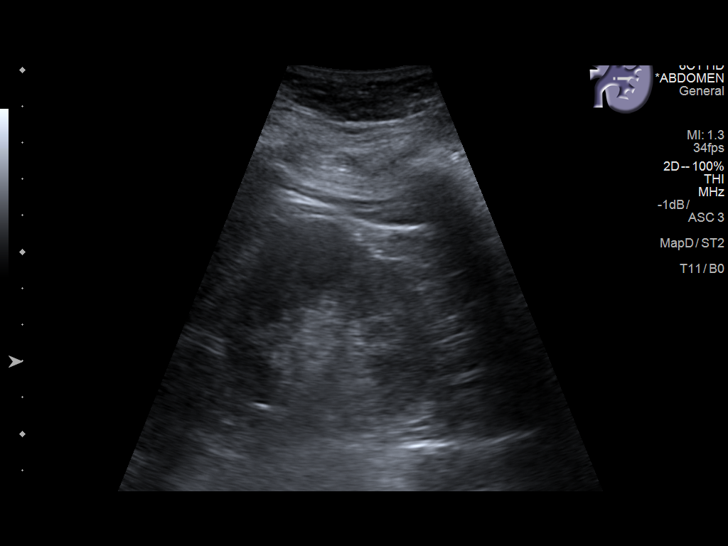
[im 17/26]
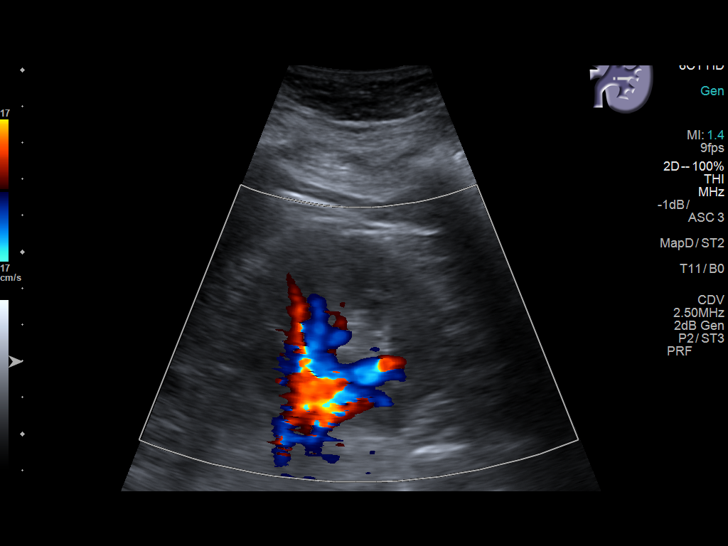
[im 19/26]
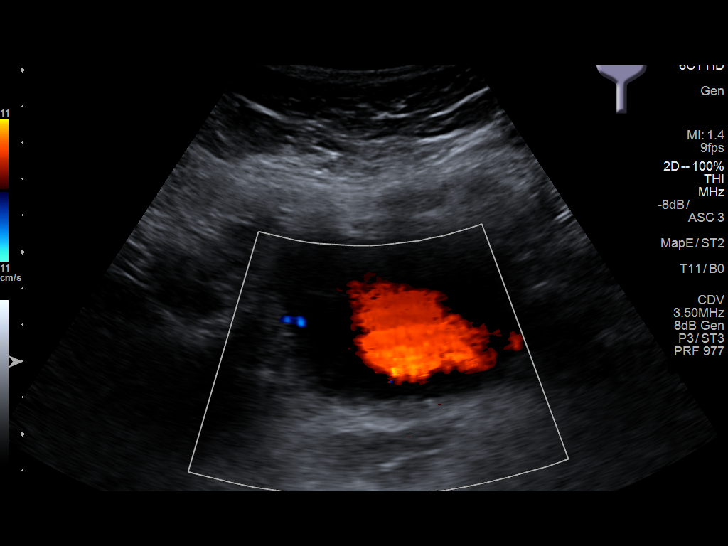
[im 21/26]
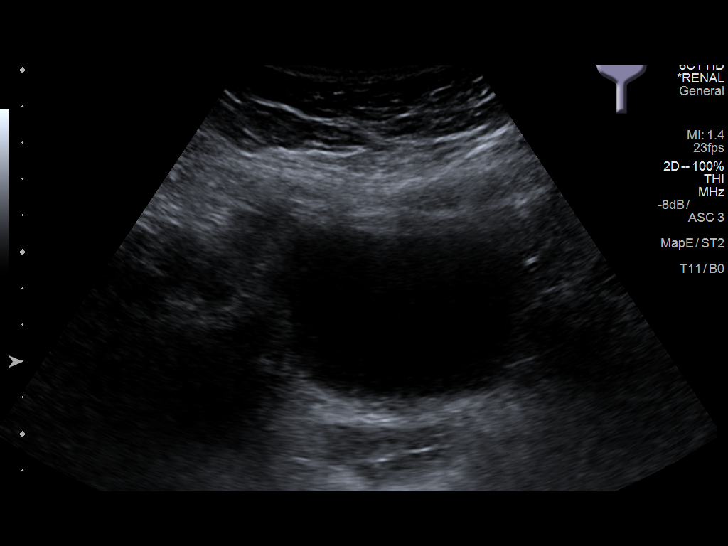
[im 23/26]
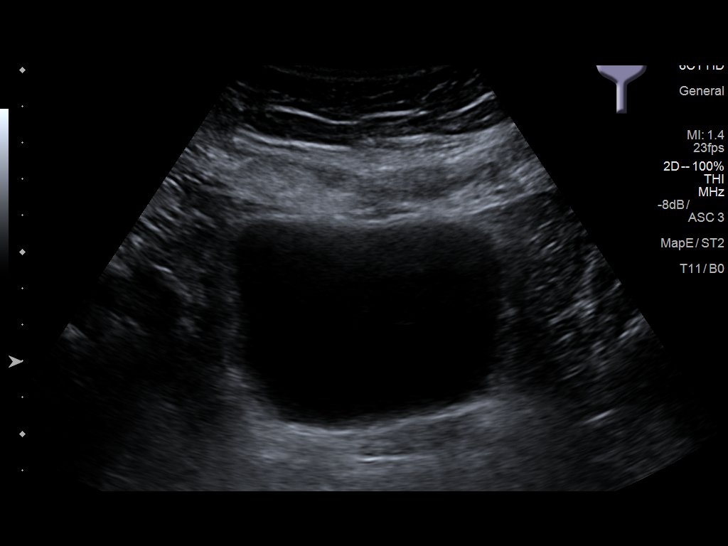
[im 26/26]
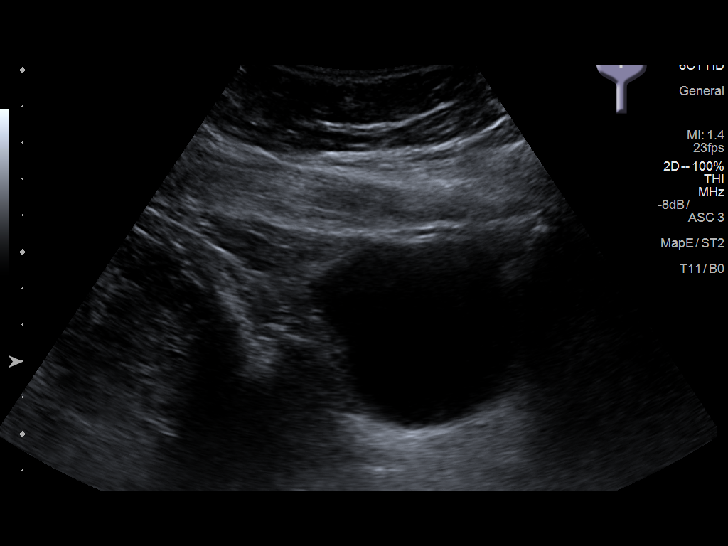

[14 of 25 positions shown; findings below may reference images not displayed]

FINDINGS: Right Kidney:

Length: 12.1 cm. Echogenicity within normal limits. No mass or
hydronephrosis visualized.

Left Kidney:

Length: 11.5 cm. Echogenicity within normal limits. No mass or
hydronephrosis visualized.

Bladder:

Appears normal for degree of bladder distention. Bilateral ureteral
jets are noted.
IMPRESSION: Normal renal ultrasound.

## 2018-07-12 ENCOUNTER — Telehealth: Payer: Self-pay | Admitting: Obstetrics & Gynecology

## 2018-07-12 NOTE — Telephone Encounter (Signed)
Spoke with patient. Patient requesting RX for yeast. Reports vaginal itching and burning externally and internally. Denies vaginal d/c, odor, bleeding or urinary symptoms. Advised OV needed for further evaluation to ensure appropriate treatment, patient declined. Advised can try OTC monistat, if symptoms do not resolve or new symptoms develop return call to office to schedule OV. Patient verbalizes understanding and is agreeable.   Patient wants Dr. Sabra Heck to know she was seen in ER due to a fall in boutique 2 wks ago, CT showed concussion. Patient thankful for assistance and guidance.   Routing to provider for final review. Patient is agreeable to disposition. Will close encounter.

## 2018-07-12 NOTE — Telephone Encounter (Signed)
Patient states she has a yeast infection and would like a prescription called in. 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 States she would have to take a day off to come in and she cannot do that. Last seen 06/26/18.

## 2018-12-06 ENCOUNTER — Other Ambulatory Visit: Payer: Self-pay

## 2018-12-06 ENCOUNTER — Inpatient Hospital Stay: Payer: BC Managed Care – PPO

## 2018-12-06 ENCOUNTER — Encounter: Payer: Self-pay | Admitting: Hematology & Oncology

## 2018-12-06 ENCOUNTER — Inpatient Hospital Stay: Payer: BC Managed Care – PPO | Attending: Family | Admitting: Hematology & Oncology

## 2018-12-06 VITALS — BP 128/93 | HR 90 | Temp 98.6°F | Resp 18 | Wt 204.0 lb

## 2018-12-06 DIAGNOSIS — D500 Iron deficiency anemia secondary to blood loss (chronic): Secondary | ICD-10-CM

## 2018-12-06 DIAGNOSIS — D509 Iron deficiency anemia, unspecified: Secondary | ICD-10-CM | POA: Insufficient documentation

## 2018-12-06 DIAGNOSIS — Z882 Allergy status to sulfonamides status: Secondary | ICD-10-CM | POA: Diagnosis not present

## 2018-12-06 DIAGNOSIS — D508 Other iron deficiency anemias: Secondary | ICD-10-CM

## 2018-12-06 DIAGNOSIS — Z881 Allergy status to other antibiotic agents status: Secondary | ICD-10-CM | POA: Diagnosis not present

## 2018-12-06 DIAGNOSIS — M25551 Pain in right hip: Secondary | ICD-10-CM | POA: Insufficient documentation

## 2018-12-06 DIAGNOSIS — K909 Intestinal malabsorption, unspecified: Secondary | ICD-10-CM | POA: Diagnosis not present

## 2018-12-06 LAB — CBC WITH DIFFERENTIAL (CANCER CENTER ONLY)
Abs Immature Granulocytes: 0.03 10*3/uL (ref 0.00–0.07)
Basophils Absolute: 0 10*3/uL (ref 0.0–0.1)
Basophils Relative: 0 %
Eosinophils Absolute: 0.1 10*3/uL (ref 0.0–0.5)
Eosinophils Relative: 2 %
HCT: 44.5 % (ref 36.0–46.0)
Hemoglobin: 15.3 g/dL — ABNORMAL HIGH (ref 12.0–15.0)
Immature Granulocytes: 0 %
Lymphocytes Relative: 23 %
Lymphs Abs: 1.7 10*3/uL (ref 0.7–4.0)
MCH: 31.8 pg (ref 26.0–34.0)
MCHC: 34.4 g/dL (ref 30.0–36.0)
MCV: 92.5 fL (ref 80.0–100.0)
Monocytes Absolute: 0.4 10*3/uL (ref 0.1–1.0)
Monocytes Relative: 5 %
Neutro Abs: 5.2 10*3/uL (ref 1.7–7.7)
Neutrophils Relative %: 70 %
Platelet Count: 193 10*3/uL (ref 150–400)
RBC: 4.81 MIL/uL (ref 3.87–5.11)
RDW: 11.8 % (ref 11.5–15.5)
WBC Count: 7.4 10*3/uL (ref 4.0–10.5)
nRBC: 0 % (ref 0.0–0.2)

## 2018-12-06 LAB — IRON AND TIBC
Iron: 141 ug/dL (ref 41–142)
Saturation Ratios: 41 % (ref 21–57)
TIBC: 341 ug/dL (ref 236–444)
UIBC: 201 ug/dL (ref 120–384)

## 2018-12-06 LAB — FERRITIN: Ferritin: 129 ng/mL (ref 11–307)

## 2018-12-06 NOTE — Progress Notes (Signed)
Hematology and Oncology Follow Up Visit  Stefanie Brown [id number] [birth year] 53 y.o. 12/06/2018   Principle Diagnosis:  Iron deficiency anemia secondary to malabsorption   Current Therapy:   IV Iron as indicated - Last received in November2017    Interim History: Stefanie Brown is here today for follow-up.  She is doing okay.  She lives up in [place].  She is a Patent examiner.  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  Everything seems to be in order for her to start school.  She feels okay.  She has had no complaints.  She has some pain in the right hip area.  We saw her a year ago, her ferritin was 100 with an iron saturation of 40%.  She is had no issues with bleeding.  She has had no nausea or vomiting.  She does not chew ice.  She is her mammograms yearly.  She should be due for mammogram next month.  Overall, her performance status is ECOG 0. Medications:  Allergies as of 12/06/2018      Reactions   Sulfa Antibiotics    Sulfamethoxazole-trimethoprim Hives, Other (See Comments)   Childhood allergy   Sulfonamide Derivatives Other (See Comments)   Childhood allergy   Varenicline       Medication List       Accurate as of December 06, 2018 12:08 PM. If you have any questions, ask your nurse or doctor.        cetirizine 10 MG tablet Commonly known as: ZYRTEC Take 10 mg by mouth daily.   Daily Probiotic Caps Take by mouth.   dexlansoprazole 60 MG capsule Commonly known as: DEXILANT Take 60 mg by mouth daily.   levothyroxine 75 MCG tablet Commonly known as: SYNTHROID Take 75 mcg by mouth daily before breakfast.   linaclotide 145 MCG Caps capsule Commonly known as: LINZESS Take 145 mcg by mouth daily before breakfast.   lisinopril 20 MG tablet Commonly known as: ZESTRIL Take 20 mg by mouth daily.   nystatin powder Commonly known as: MYCOSTATIN/NYSTOP Apply topically 2 (two) times daily. Apply to affected area for up to 7 days       Allergies:  Allergies  Allergen Reactions  . Sulfa Antibiotics   . Sulfamethoxazole-Trimethoprim Hives and Other (See Comments)    Childhood allergy  . Sulfonamide Derivatives Other (See Comments)    Childhood allergy  . Varenicline     Past Medical History, Surgical history, Social history, and Family History were reviewed and updated.  Review of Systems: Review of Systems  Constitutional: Negative.   HENT: Negative.   Eyes: Negative.   Respiratory: Negative.   Cardiovascular: Negative.   Gastrointestinal: Negative.   Genitourinary: Negative.   Skin: Negative.   Neurological: Negative.   Endo/Heme/Allergies: Negative.   Psychiatric/Behavioral: Negative.      Physical Exam:  weight is 204 lb (92.5 kg). Her oral temperature is 98.6 F (37 C). Her blood pressure is 128/93 (abnormal) and her pulse is 90. Her respiration is 18 and oxygen saturation is 100%.   Wt Readings from Last 3 Encounters:  12/06/18 204 lb (92.5 kg)  06/26/18 204 lb (92.5 kg)  03/15/18 202 lb 3.2 oz (91.7 kg)    Physical Exam Vitals signs reviewed.  HENT:     Head: Normocephalic and atraumatic.  Eyes:     Pupils: Pupils are equal, round, and reactive to light.  Neck:     Musculoskeletal: Normal range of motion.  Cardiovascular:     Rate and Rhythm: Normal rate and regular rhythm.     Heart sounds: Normal heart sounds.  Pulmonary:     Effort: Pulmonary effort is normal.     Breath sounds: Normal breath sounds.  Abdominal:     General: Bowel sounds are normal.     Palpations: Abdomen is soft.  Musculoskeletal: Normal range of motion.        General: No tenderness or deformity.  Lymphadenopathy:     Cervical: No cervical adenopathy.  Skin:    General: Skin is warm and dry.     Findings: No erythema or rash.  Neurological:     Mental Status: She is alert and oriented to person, place, and time.  Psychiatric:        Behavior: Behavior normal.        Thought Content: Thought content  normal.        Judgment: Judgment normal.      Lab Results  Component Value Date   WBC 7.4 12/06/2018   HGB 15.3 (H) 12/06/2018   HCT 44.5 12/06/2018   MCV 92.5 12/06/2018   PLT 193 12/06/2018   Lab Results  Component Value Date   FERRITIN 100 12/07/2017   IRON 145 12/07/2017   TIBC 363 12/07/2017   UIBC 218 12/07/2017   IRONPCTSAT 40 (H) 12/07/2017   Lab Results  Component Value Date   RBC 4.81 12/06/2018   No results found for: KPAFRELGTCHN, LAMBDASER, KAPLAMBRATIO No results found for: IGGSERUM, IGA, IGMSERUM No results found for: Odetta Pink, SPEI   Chemistry      Component Value Date/Time   NA 140 12/07/2017 1108   NA 139 04/11/2017 1102   K 4.3 12/07/2017 1108   K 4.2 04/11/2017 1102   CL 106 12/07/2017 1108   CO2 26 12/07/2017 1108   CO2 25 04/11/2017 1102   BUN 12 12/07/2017 1108   BUN 14.6 04/11/2017 1102   CREATININE 0.77 12/07/2017 1108   CREATININE 0.7 04/11/2017 1102      Component Value Date/Time   CALCIUM 9.7 12/07/2017 1108   CALCIUM 9.5 04/11/2017 1102   ALKPHOS 60 12/07/2017 1108   ALKPHOS 52 04/11/2017 1102   AST 19 12/07/2017 1108   AST 19 04/11/2017 1102   ALT 30 12/07/2017 1108   ALT 32 04/11/2017 1102   BILITOT 0.6 12/07/2017 1108   BILITOT 0.45 04/11/2017 1102      Impression and Plan: Stefanie Brown is a very pleasant 53 yo caucasian female with history of iron deficiency secondary to malabsorption.  I do believe that her iron level will be okay.  Her MCV is 92.  I will plan to get her back in another year.  She knows that she always come back sooner if there are any problems.     Volanda Napoleon, MD 6/25/202012:08 PM

## 2019-01-07 ENCOUNTER — Encounter: Payer: Self-pay | Admitting: Obstetrics & Gynecology

## 2019-10-03 ENCOUNTER — Other Ambulatory Visit: Payer: Self-pay

## 2019-10-04 ENCOUNTER — Ambulatory Visit (INDEPENDENT_AMBULATORY_CARE_PROVIDER_SITE_OTHER): Payer: BC Managed Care – PPO | Admitting: Obstetrics & Gynecology

## 2019-10-04 ENCOUNTER — Other Ambulatory Visit: Payer: Self-pay

## 2019-10-04 ENCOUNTER — Encounter: Payer: Self-pay | Admitting: Obstetrics & Gynecology

## 2019-10-04 VITALS — BP 120/74 | HR 68 | Temp 97.9°F | Resp 16 | Ht 67.75 in | Wt 204.0 lb

## 2019-10-04 DIAGNOSIS — Z01419 Encounter for gynecological examination (general) (routine) without abnormal findings: Secondary | ICD-10-CM | POA: Diagnosis not present

## 2019-10-04 NOTE — Progress Notes (Signed)
54 y.o. G69P1011 Married White or Caucasian female here for annual exam.  Doing well.  Did receive her Covid vaccination.  Denies vaginal bleeding.  Having some breast pain x 3 weeks.    Still having low back pain.  Has seen PT in the past.  Started on cholesterol medication this past year.  Has follow up scheduled in 1-2 weeks.    Patient's last menstrual period was 08/13/2015 (exact date).          Sexually active: Yes.    The current method of family planning is status post hysterectomy.    Exercising: No.  exercise Smoker:  no  . Health Maintenance: Pap:  08-13-15 AGUS HPV HR neg (08/31/15--surgical pathology with hysterectomy was negative) History of abnormal Pap:  Yes, 2017 ECC & endometrial biopsy neg MMG:  01-07-2019 category b density birads 1:neg Colonoscopy:  11/2018 f/u 64yrs (in Care Everywhere) BMD:   none TDaP:  2016 Pneumonia vaccine(s):  no Shingrix:   Not interested in having this right now Hep C testing: not done Screening Labs: had blood work done with Dr. Brynda Greathouse   reports that she quit smoking about 7 years ago. Her smoking use included cigarettes. She has a 12.50 pack-year smoking history. She has never used smokeless tobacco. She reports current alcohol use of about 3.0 standard drinks of alcohol per week. She reports that she does not use drugs.  Past Medical History:  Diagnosis Date  . Anxiety   . Depression   . GERD (gastroesophageal reflux disease)   . History of hiatal hernia   . Hypothyroidism   . STD (sexually transmitted disease)    HSV 2    Past Surgical History:  Procedure Laterality Date  . BILATERAL SALPINGECTOMY Bilateral 08/31/2015   Procedure: BILATERAL SALPINGECTOMY;  Surgeon: Megan Salon, MD;  Location: [facility];  Service: Gynecology;  Laterality: Bilateral;  . CHOLECYSTECTOMY  2016  . CYSTO N/A 08/31/2015   Procedure: CYSTO;  Surgeon: Megan Salon, MD;  Location: [facility];  Service: Gynecology;  Laterality: N/A;  . DIAGNOSTIC LAPAROSCOPY     . DILATION AND CURETTAGE OF UTERUS    . LAPAROSCOPIC HYSTERECTOMY N/A 08/31/2015   Procedure: HYSTERECTOMY TOTAL LAPAROSCOPIC;  Surgeon: Megan Salon, MD;  Location: [facility];  Service: Gynecology;  Laterality: N/A;  . TUBAL LIGATION Bilateral 2001    Current Outpatient Medications  Medication Sig Dispense Refill  . atorvastatin (LIPITOR) 20 MG tablet Take 20 mg by mouth daily.    . cetirizine (ZYRTEC) 10 MG tablet Take 10 mg by mouth daily.    Marland Kitchen dexlansoprazole (DEXILANT) 60 MG capsule Take 60 mg by mouth daily.    Marland Kitchen levothyroxine (SYNTHROID, LEVOTHROID) 75 MCG tablet Take 75 mcg by mouth daily before breakfast.     . lisinopril (PRINIVIL,ZESTRIL) 20 MG tablet Take 20 mg by mouth daily.    . Probiotic Product (DAILY PROBIOTIC) CAPS Take by mouth.     No current facility-administered medications for this visit.    Family History  Problem Relation Age of Onset  . Parkinson's disease Mother   . Cancer Mother        bladder, bladder removed  . Hypertension Father   . Heart attack Father     Review of Systems  Constitutional: Negative.   HENT: Negative.   Eyes: Negative.   Respiratory: Negative.   Cardiovascular: Negative.   Gastrointestinal: Negative.   Endocrine: Negative.   Genitourinary: Negative.   Musculoskeletal: Negative.   Skin: Negative.  Allergic/Immunologic: Negative.   Neurological: Negative.   Psychiatric/Behavioral: Negative.     Exam:   BP 120/74   Pulse 68   Temp 97.9 F (36.6 C) (Skin)   Resp 16   Ht 5' 7.75" (1.721 m)   Wt 204 lb (92.5 kg)   LMP 08/13/2015 (Exact Date)   BMI 31.25 kg/m   Height: 5' 7.75" (172.1 cm)  General appearance: alert, cooperative and appears stated age Head: Normocephalic, without obvious abnormality, atraumatic Neck: no adenopathy, supple, symmetrical, trachea midline and thyroid normal to inspection and palpation Lungs: clear to auscultation bilaterally Breasts: normal appearance, no masses or tenderness Heart:  regular rate and rhythm Abdomen: soft, non-tender; bowel sounds normal; no masses,  no organomegaly Extremities: extremities normal, atraumatic, no cyanosis or edema Skin: Skin color, texture, turgor normal. No rashes or lesions Lymph nodes: Cervical, supraclavicular, and axillary nodes normal. No abnormal inguinal nodes palpated Neurologic: Grossly normal   Pelvic: External genitalia:  no lesions              Urethra:  normal appearing urethra with no masses, tenderness or lesions              Bartholins and Skenes: normal                 Vagina: normal appearing vagina with normal color and discharge, no lesions              Cervix: absent              Pap taken: No. Bimanual Exam:  Uterus:  uterus absent              Adnexa: normal adnexa and no mass, fullness, tenderness               Rectovaginal: Confirms               Anus:  normal sphincter tone, no lesions  Chaperone, Royal Hawthorn, CMA, was present for exam.  A:  Well Woman with normal exam H/o TLH/bilateral salpingectomy 08/2015 Low back pain with continued LLQ pain, stable Hypothyroidism H/o iron deficiency anemia, followed by Dr. Marin Olp GERD H/o colon polyps hypertension   P:   Mammogram guidelines reviewed.  Doing yearly. Pap smear not indicated Colonoscopy is UTD Lab work done with Dr. Brynda Greathouse and UTD.  Does not need any today. Will plan BMD once fully menopausal return annually or prn

## 2019-10-29 ENCOUNTER — Ambulatory Visit: Payer: BLUE CROSS/BLUE SHIELD | Admitting: Obstetrics & Gynecology

## 2019-12-05 ENCOUNTER — Inpatient Hospital Stay: Payer: BC Managed Care – PPO | Admitting: Family

## 2019-12-05 ENCOUNTER — Inpatient Hospital Stay: Payer: BC Managed Care – PPO

## 2019-12-06 ENCOUNTER — Other Ambulatory Visit: Payer: BC Managed Care – PPO

## 2019-12-06 ENCOUNTER — Ambulatory Visit: Payer: BC Managed Care – PPO | Admitting: Family

## 2019-12-10 ENCOUNTER — Inpatient Hospital Stay (HOSPITAL_BASED_OUTPATIENT_CLINIC_OR_DEPARTMENT_OTHER): Payer: BC Managed Care – PPO | Admitting: Family

## 2019-12-10 ENCOUNTER — Other Ambulatory Visit: Payer: Self-pay

## 2019-12-10 ENCOUNTER — Inpatient Hospital Stay: Payer: BC Managed Care – PPO | Attending: Hematology & Oncology

## 2019-12-10 ENCOUNTER — Other Ambulatory Visit: Payer: Self-pay | Admitting: Family

## 2019-12-10 VITALS — BP 107/90 | HR 92 | Temp 98.4°F | Resp 18 | Wt 206.0 lb

## 2019-12-10 DIAGNOSIS — M7989 Other specified soft tissue disorders: Secondary | ICD-10-CM | POA: Insufficient documentation

## 2019-12-10 DIAGNOSIS — R202 Paresthesia of skin: Secondary | ICD-10-CM | POA: Insufficient documentation

## 2019-12-10 DIAGNOSIS — D500 Iron deficiency anemia secondary to blood loss (chronic): Secondary | ICD-10-CM

## 2019-12-10 DIAGNOSIS — K909 Intestinal malabsorption, unspecified: Secondary | ICD-10-CM | POA: Insufficient documentation

## 2019-12-10 DIAGNOSIS — R200 Anesthesia of skin: Secondary | ICD-10-CM | POA: Diagnosis not present

## 2019-12-10 DIAGNOSIS — D508 Other iron deficiency anemias: Secondary | ICD-10-CM | POA: Diagnosis present

## 2019-12-10 DIAGNOSIS — R319 Hematuria, unspecified: Secondary | ICD-10-CM | POA: Diagnosis not present

## 2019-12-10 DIAGNOSIS — Z881 Allergy status to other antibiotic agents status: Secondary | ICD-10-CM | POA: Diagnosis not present

## 2019-12-10 DIAGNOSIS — Z882 Allergy status to sulfonamides status: Secondary | ICD-10-CM | POA: Diagnosis not present

## 2019-12-10 LAB — CMP (CANCER CENTER ONLY)
ALT: 30 U/L (ref 0–44)
AST: 18 U/L (ref 15–41)
Albumin: 4 g/dL (ref 3.5–5.0)
Alkaline Phosphatase: 69 U/L (ref 38–126)
Anion gap: 9 (ref 5–15)
BUN: 14 mg/dL (ref 6–20)
CO2: 26 mmol/L (ref 22–32)
Calcium: 9.5 mg/dL (ref 8.9–10.3)
Chloride: 106 mmol/L (ref 98–111)
Creatinine: 0.76 mg/dL (ref 0.44–1.00)
GFR, Est AFR Am: >60 mL/min (ref >60)
GFR, Estimated: >60 mL/min (ref >60)
Glucose, Bld: 111 mg/dL — ABNORMAL HIGH (ref 70–99)
Potassium: 3.9 mmol/L (ref 3.5–5.1)
Sodium: 141 mmol/L (ref 135–145)
Total Bilirubin: 0.5 mg/dL (ref 0.3–1.2)
Total Protein: 6.7 g/dL (ref 6.5–8.1)

## 2019-12-10 LAB — CBC WITH DIFFERENTIAL (CANCER CENTER ONLY)
Abs Immature Granulocytes: 0.09 10*3/uL — ABNORMAL HIGH (ref 0.00–0.07)
Basophils Absolute: 0.1 10*3/uL (ref 0.0–0.1)
Basophils Relative: 1 %
Eosinophils Absolute: 0.2 10*3/uL (ref 0.0–0.5)
Eosinophils Relative: 2 %
HCT: 43.2 % (ref 36.0–46.0)
Hemoglobin: 15 g/dL (ref 12.0–15.0)
Immature Granulocytes: 1 %
Lymphocytes Relative: 23 %
Lymphs Abs: 2 10*3/uL (ref 0.7–4.0)
MCH: 31.6 pg (ref 26.0–34.0)
MCHC: 34.7 g/dL (ref 30.0–36.0)
MCV: 90.9 fL (ref 80.0–100.0)
Monocytes Absolute: 0.6 10*3/uL (ref 0.1–1.0)
Monocytes Relative: 6 %
Neutro Abs: 5.9 10*3/uL (ref 1.7–7.7)
Neutrophils Relative %: 67 %
Platelet Count: 191 10*3/uL (ref 150–400)
RBC: 4.75 MIL/uL (ref 3.87–5.11)
RDW: 11.7 % (ref 11.5–15.5)
WBC Count: 8.8 10*3/uL (ref 4.0–10.5)
nRBC: 0 % (ref 0.0–0.2)

## 2019-12-10 LAB — RETICULOCYTES
Immature Retic Fract: 9.2 % (ref 2.3–15.9)
RBC.: 4.71 MIL/uL (ref 3.87–5.11)
Retic Count, Absolute: 82 10*3/uL (ref 19.0–186.0)
Retic Ct Pct: 1.7 % (ref 0.4–3.1)

## 2019-12-10 NOTE — Progress Notes (Signed)
Hematology and Oncology Follow Up Visit  Stefanie Brown [id number] Jun 30, 1965 54 y.o. 12/10/2019   Principle Diagnosis:  Iron deficiency anemia secondary to malabsorption   Current Therapy:        IV Iron as indicated - Last received in November2017   Interim History:  Stefanie Brown is here today for follow-up. She is doing fairly well but has had a couple falls over the last few months. She has generalized aches and pains all over and particularly above her tailbone. She is hoping to see Rheumatology in September.  No syncope.  She has also noted positional numbness and tingling in her hands at times.  She feels swelling in her hands and feet that comes and goes. No redness or edema noted on today's exam. Pedal pulses are 2+.  Despite all this she is still pushing herself daily to walk for exercise.  She also had blood in her urine recently and is waiting to schedule an appointment Urology. No other blood loss noted. No bruising or petechiae.  No fever, chills, n/v, cough, rash, dizziness, SOB, chest pain, palpitations, abdominal pain or changes in bowel or bladder habits.  She has a good appetite and is staying well hydrated. Her weight is stable.   ECOG Performance Status: 1 - Symptomatic but completely ambulatory  Medications:  Allergies as of 12/10/2019      Reactions   Sulfa Antibiotics    Sulfamethoxazole-trimethoprim Hives, Other (See Comments)   Childhood allergy   Sulfonamide Derivatives Other (See Comments)   Childhood allergy   Varenicline       Medication List       Accurate as of December 10, 2019  3:38 PM. If you have any questions, ask your nurse or doctor.        STOP taking these medications   dexlansoprazole 60 MG capsule Commonly known as: DEXILANT Stopped by: [name], NP     TAKE these medications   atorvastatin 20 MG tablet Commonly known as: LIPITOR Take 20 mg by mouth daily.   cetirizine 10 MG tablet Commonly known as: ZYRTEC Take 10  mg by mouth daily.   Daily Probiotic Caps Take by mouth.   lansoprazole 15 MG capsule Commonly known as: PREVACID Take 15 mg by mouth daily at 12 noon.   levothyroxine 75 MCG tablet Commonly known as: SYNTHROID Take 75 mcg by mouth daily before breakfast.   lisinopril 20 MG tablet Commonly known as: ZESTRIL Take 20 mg by mouth daily.       Allergies:  Allergies  Allergen Reactions  . Sulfa Antibiotics   . Sulfamethoxazole-Trimethoprim Hives and Other (See Comments)    Childhood allergy  . Sulfonamide Derivatives Other (See Comments)    Childhood allergy  . Varenicline     Past Medical History, Surgical history, Social history, and Family History were reviewed and updated.  Review of Systems: All other 10 point review of systems is negative.   Physical Exam:  weight is 206 lb (93.4 kg). Her oral temperature is 98.4 F (36.9 C). Her blood pressure is 107/90 and her pulse is 92. Her respiration is 18 and oxygen saturation is 99%.   Wt Readings from Last 3 Encounters:  12/10/19 206 lb (93.4 kg)  10/04/19 204 lb (92.5 kg)  12/06/18 204 lb (92.5 kg)    Ocular: Sclerae unicteric, pupils equal, round and reactive to light Ear-nose-throat: Oropharynx clear, dentition fair Lymphatic: No cervical or supraclavicular adenopathy Lungs no rales or rhonchi, good excursion bilaterally Heart regular  rate and rhythm, no murmur appreciated Abd soft, nontender, positive bowel sounds, no liver or spleen tip palpated on exam, no fluid wave  MSK no focal spinal tenderness, no joint edema Neuro: non-focal, well-oriented, appropriate affect Breasts: Deferred   Lab Results  Component Value Date   WBC 8.8 12/10/2019   HGB 15.0 12/10/2019   HCT 43.2 12/10/2019   MCV 90.9 12/10/2019   PLT 191 12/10/2019   Lab Results  Component Value Date   FERRITIN 129 12/06/2018   IRON 141 12/06/2018   TIBC 341 12/06/2018   UIBC 201 12/06/2018   IRONPCTSAT 41 12/06/2018   Lab Results    Component Value Date   RETICCTPCT 1.7 12/10/2019   RBC 4.71 12/10/2019   RBC 4.75 12/10/2019   No results found for: KPAFRELGTCHN, LAMBDASER, KAPLAMBRATIO No results found for: IGGSERUM, IGA, IGMSERUM No results found for: Odetta Pink, SPEI   Chemistry      Component Value Date/Time   NA 140 12/07/2017 1108   NA 139 04/11/2017 1102   K 4.3 12/07/2017 1108   K 4.2 04/11/2017 1102   CL 106 12/07/2017 1108   CO2 26 12/07/2017 1108   CO2 25 04/11/2017 1102   BUN 12 12/07/2017 1108   BUN 14.6 04/11/2017 1102   CREATININE 0.77 12/07/2017 1108   CREATININE 0.7 04/11/2017 1102      Component Value Date/Time   CALCIUM 9.7 12/07/2017 1108   CALCIUM 9.5 04/11/2017 1102   ALKPHOS 60 12/07/2017 1108   ALKPHOS 52 04/11/2017 1102   AST 19 12/07/2017 1108   AST 19 04/11/2017 1102   ALT 30 12/07/2017 1108   ALT 32 04/11/2017 1102   BILITOT 0.6 12/07/2017 1108   BILITOT 0.45 04/11/2017 1102       Impression and Plan: Stefanie Brown is a very pleasant 54 yo caucasian female with iron deficiency anemia secondary to malabsorption.  We will see what her iron studies show and bring her back in for infusion if needed.  We will plan to see her again in another year.  She will contact our office with any questions or concerns. We can certainly see her sooner if needed.   [name], NP 6/29/20213:38 PM

## 2019-12-11 ENCOUNTER — Telehealth: Payer: Self-pay | Admitting: Family

## 2019-12-11 LAB — IRON AND TIBC
Iron: 69 ug/dL (ref 41–142)
Saturation Ratios: 22 % (ref 21–57)
TIBC: 323 ug/dL (ref 236–444)
UIBC: 253 ug/dL (ref 120–384)

## 2019-12-11 LAB — FERRITIN: Ferritin: 170 ng/mL (ref 11–307)

## 2019-12-11 NOTE — Telephone Encounter (Signed)
No los 6/29

## 2020-01-21 ENCOUNTER — Encounter: Payer: Self-pay | Admitting: Obstetrics & Gynecology

## 2020-12-11 ENCOUNTER — Ambulatory Visit: Payer: BC Managed Care – PPO

## 2020-12-21 NOTE — Progress Notes (Signed)
55 y.o. G52P1011 Married White or Caucasian female here for annual exam.  Caring for her father now.  He is now 43.  She fell at school by tripping over a child.  She broke her 8th rib.  Denies vaginal bleeding.    Had testing done with rheumatology, Dr. Manuella Ghazi.  She has cervical spinal stenosis.  Having some difficulty with moving right arm.  Saw Dr. Latanya Maudlin.  Has not been seen since before the fall.    Patient's last menstrual period was 08/13/2015 (exact date).          Sexually active: Yes.    The current method of family planning is status post hysterectomy.    Exercising: exercising with bands and walking Smoker:  no  Health Maintenance: Pap:  08/13/2015, Hysterectomy 08/31/2015 History of abnormal Pap:  yes MMG:  01/09/2020 Colonoscopy:  2020, follow up 7 years BMD:   not done TDaP:  04/2015 Shingrix:   discussed Hep C testing: 02/2020 Screening Labs: done in September   reports that she quit smoking about 8 years ago. Her smoking use included cigarettes. She has a 12.50 pack-year smoking history. She has never used smokeless tobacco. She reports current alcohol use of about 3.0 standard drinks of alcohol per week. She reports that she does not use drugs.  Past Medical History:  Diagnosis Date   Anxiety    Depression    GERD (gastroesophageal reflux disease)    History of hiatal hernia    Hypothyroidism    STD (sexually transmitted disease)    HSV 2    Past Surgical History:  Procedure Laterality Date   BILATERAL SALPINGECTOMY Bilateral 08/31/2015   Procedure: BILATERAL SALPINGECTOMY;  Surgeon: Megan Salon, MD;  Location: [facility];  Service: Gynecology;  Laterality: Bilateral;   CHOLECYSTECTOMY  2016   CYSTO N/A 08/31/2015   Procedure: CYSTO;  Surgeon: Megan Salon, MD;  Location: [facility];  Service: Gynecology;  Laterality: N/A;   DIAGNOSTIC LAPAROSCOPY     DILATION AND CURETTAGE OF UTERUS     LAPAROSCOPIC HYSTERECTOMY N/A 08/31/2015   Procedure: HYSTERECTOMY TOTAL  LAPAROSCOPIC;  Surgeon: Megan Salon, MD;  Location: [facility];  Service: Gynecology;  Laterality: N/A;   TUBAL LIGATION Bilateral 2001    Current Outpatient Medications  Medication Sig Dispense Refill   amLODipine (NORVASC) 5 MG tablet Take 5 mg by mouth daily.     atorvastatin (LIPITOR) 20 MG tablet Take 20 mg by mouth daily.     calcium-vitamin D (OSCAL WITH D) 500-200 MG-UNIT tablet Take 1 tablet by mouth.     cetirizine (ZYRTEC) 10 MG tablet Take 10 mg by mouth daily.     lansoprazole (PREVACID) 15 MG capsule Take 15 mg by mouth daily at 12 noon.     levothyroxine (SYNTHROID, LEVOTHROID) 75 MCG tablet Take 75 mcg by mouth daily before breakfast.      lisinopril (PRINIVIL,ZESTRIL) 20 MG tablet Take 20 mg by mouth daily.     meloxicam (MOBIC) 7.5 MG tablet Take 7.5 mg by mouth daily.     Probiotic Product (DAILY PROBIOTIC) CAPS Take by mouth.     No current facility-administered medications for this visit.    Family History  Problem Relation Age of Onset   Parkinson's disease Mother    Cancer Mother        bladder, bladder removed   Hypertension Father    Heart attack Father     Review of Systems  All other systems reviewed and  are negative.  Exam:   BP 118/86   Pulse 91   Ht 5' 6.5" (1.689 m)   Wt 210 lb (95.3 kg)   LMP 08/13/2015 (Exact Date)   BMI 33.39 kg/m   Height: 5' 6.5" (168.9 cm)  General appearance: alert, cooperative and appears stated age Head: Normocephalic, without obvious abnormality, atraumatic Neck: no adenopathy, supple, symmetrical, trachea midline and thyroid normal to inspection and palpation Lungs: clear to auscultation bilaterally Breasts:left breast without mass, skin change, LAD, nipple discharge; right breast with large sebaceous cyst, but no other mass, no LAD, no skin change or LAD Heart: regular rate and rhythm Abdomen: soft, non-tender; bowel sounds normal; no masses,  no organomegaly Extremities: extremities normal, atraumatic, no  cyanosis or edema Skin: Skin color, texture, turgor normal. No rashes or lesions Lymph nodes: Cervical, supraclavicular, and axillary nodes normal. No abnormal inguinal nodes palpated Neurologic: Grossly normal  Pelvic: External genitalia:  no lesions              Urethra:  normal appearing urethra with no masses, tenderness or lesions              Bartholins and Skenes: normal                 Vagina: normal appearing vagina with normal color and no discharge, no lesions              Cervix: absent              Pap taken: Yes.   Bimanual Exam:  Uterus:  surgically absent              Adnexa: normal adnexa and no mass, fullness, tenderness               Rectovaginal: Confirms               Anus:  normal sphincter tone, no lesions  Chaperone, Octaviano Batty, CMA, was present for exam.  Assessment/Plan: 1. Well woman exam with routine gynecological exam - pap smear obtained today due to AGUS pap prior to hysterectomy - MMG 01/09/2020 - colonoscopy 2020 with follow up 7 years - vaccines reviewed - plan BMD closer to age 65 - lab work is done with Dr. Brynda Greathouse  2. Breast mass - this is a large sebaceous cyst.  Due to size, feel removal is appropriate. - Ambulatory referral to Plastic Surgery  3. H/O vaginal hysterectomy - not on HRT  4. Acute pain of right shoulder - names for orthopedic providers who specialize in shoulder issues given  5. Acquired hypothyroidism  6. Iron deficiency anemia due to chronic blood loss - followed by hematology after hysterectomy but last visit 11/2019 showed normal and stable labs

## 2020-12-22 ENCOUNTER — Other Ambulatory Visit (HOSPITAL_COMMUNITY)
Admission: RE | Admit: 2020-12-22 | Discharge: 2020-12-22 | Disposition: A | Discharge status: Home / Self Care | Payer: BC Managed Care – PPO | Source: Ambulatory Visit | Attending: Obstetrics & Gynecology | Admitting: Obstetrics & Gynecology

## 2020-12-22 ENCOUNTER — Other Ambulatory Visit: Payer: Self-pay

## 2020-12-22 ENCOUNTER — Encounter (HOSPITAL_BASED_OUTPATIENT_CLINIC_OR_DEPARTMENT_OTHER): Payer: Self-pay | Admitting: Obstetrics & Gynecology

## 2020-12-22 ENCOUNTER — Ambulatory Visit (INDEPENDENT_AMBULATORY_CARE_PROVIDER_SITE_OTHER): Payer: BC Managed Care – PPO | Admitting: Obstetrics & Gynecology

## 2020-12-22 VITALS — BP 118/86 | HR 91 | Ht 66.5 in | Wt 210.0 lb

## 2020-12-22 DIAGNOSIS — Z90710 Acquired absence of both cervix and uterus: Secondary | ICD-10-CM | POA: Diagnosis not present

## 2020-12-22 DIAGNOSIS — N63 Unspecified lump in unspecified breast: Secondary | ICD-10-CM

## 2020-12-22 DIAGNOSIS — Z124 Encounter for screening for malignant neoplasm of cervix: Secondary | ICD-10-CM | POA: Diagnosis present

## 2020-12-22 DIAGNOSIS — Z01419 Encounter for gynecological examination (general) (routine) without abnormal findings: Secondary | ICD-10-CM

## 2020-12-22 DIAGNOSIS — D500 Iron deficiency anemia secondary to blood loss (chronic): Secondary | ICD-10-CM

## 2020-12-22 DIAGNOSIS — E039 Hypothyroidism, unspecified: Secondary | ICD-10-CM

## 2020-12-22 DIAGNOSIS — M25511 Pain in right shoulder: Secondary | ICD-10-CM

## 2020-12-22 DIAGNOSIS — N6081 Other benign mammary dysplasias of right breast: Secondary | ICD-10-CM

## 2020-12-22 NOTE — Patient Instructions (Signed)
Justice Britain, MD [facility] [address] [place], [place], [address] [phone]

## 2020-12-23 DIAGNOSIS — N63 Unspecified lump in unspecified breast: Secondary | ICD-10-CM | POA: Insufficient documentation

## 2020-12-23 DIAGNOSIS — N6081 Other benign mammary dysplasias of right breast: Secondary | ICD-10-CM | POA: Insufficient documentation

## 2020-12-28 LAB — CYTOLOGY - PAP
Adequacy: ABSENT
Comment: NEGATIVE
Diagnosis: NEGATIVE
High risk HPV: NEGATIVE

## 2021-01-14 ENCOUNTER — Encounter (HOSPITAL_BASED_OUTPATIENT_CLINIC_OR_DEPARTMENT_OTHER): Payer: Self-pay | Admitting: Obstetrics & Gynecology

## 2021-03-02 ENCOUNTER — Encounter (HOSPITAL_BASED_OUTPATIENT_CLINIC_OR_DEPARTMENT_OTHER): Payer: Self-pay

## 2021-03-03 MED ORDER — NYSTATIN 100000 UNIT/GM EX POWD: 1.0000 "application " | Freq: Two times a day (BID) | CUTANEOUS | 0 refills | Status: AC

## 2021-05-21 ENCOUNTER — Ambulatory Visit (INDEPENDENT_AMBULATORY_CARE_PROVIDER_SITE_OTHER): Payer: BC Managed Care – PPO | Admitting: Plastic Surgery

## 2021-05-21 ENCOUNTER — Other Ambulatory Visit: Payer: Self-pay

## 2021-05-21 VITALS — BP 128/86 | HR 70 | Ht 68.0 in | Wt 208.0 lb

## 2021-05-21 DIAGNOSIS — N6081 Other benign mammary dysplasias of right breast: Secondary | ICD-10-CM

## 2021-05-21 NOTE — Progress Notes (Signed)
Patient ID: Stefanie Brown, female    DOB: 09/23/1965, 55 y.o.   MRN: [id number]   Chief Complaint  Patient presents with   Advice Only    The patient is a 55 year old female here for evaluation of her right breast.  Had a cyst on the medial aspect of the right breast at the inframammary fold several years ago.  She had it drained.  Since then it is gotten significantly larger.  She is very concerned about the size and she feels that it smells as well she would like to see if it can be removed.  She is 5 feet 7 inches tall and weighs 206 pounds.  She is otherwise in good health.  Her last hemoglobin A1c was 5.4 according to her records from 2019.  She has hyperlipidemia and hypertension.  She is also being treated for thyroid disease.  The lesion is at the inframammary fold and is firm.  It is approximately 3 cm in size and slightly tender.  Patient also describes pain with trying to wear a bra.  Her mammograms have been negative.   Review of Systems  Constitutional: Negative.   HENT: Negative.    Eyes: Negative.   Respiratory: Negative.    Cardiovascular: Negative.   Gastrointestinal: Negative.   Endocrine: Negative.   Genitourinary: Negative.   Neurological: Negative.   Hematological: Negative.    Past Medical History:  Diagnosis Date   Anxiety    Depression    GERD (gastroesophageal reflux disease)    History of hiatal hernia    Hypothyroidism    STD (sexually transmitted disease)    HSV 2    Past Surgical History:  Procedure Laterality Date   BILATERAL SALPINGECTOMY Bilateral 08/31/2015   Procedure: BILATERAL SALPINGECTOMY;  Surgeon: Megan Salon, MD;  Location: [facility];  Service: Gynecology;  Laterality: Bilateral;   CHOLECYSTECTOMY  2016   CYSTO N/A 08/31/2015   Procedure: CYSTO;  Surgeon: Megan Salon, MD;  Location: [facility];  Service: Gynecology;  Laterality: N/A;   DIAGNOSTIC LAPAROSCOPY     DILATION AND CURETTAGE OF UTERUS     LAPAROSCOPIC HYSTERECTOMY N/A  08/31/2015   Procedure: HYSTERECTOMY TOTAL LAPAROSCOPIC;  Surgeon: Megan Salon, MD;  Location: [facility];  Service: Gynecology;  Laterality: N/A;   TUBAL LIGATION Bilateral 2001      Current Outpatient Medications:    amLODipine (NORVASC) 5 MG tablet, Take 5 mg by mouth daily., Disp: , Rfl:    atorvastatin (LIPITOR) 20 MG tablet, Take 20 mg by mouth daily., Disp: , Rfl:    calcium-vitamin D (OSCAL WITH D) 500-200 MG-UNIT tablet, Take 1 tablet by mouth., Disp: , Rfl:    cetirizine (ZYRTEC) 10 MG tablet, Take 10 mg by mouth daily., Disp: , Rfl:    lansoprazole (PREVACID) 15 MG capsule, Take 15 mg by mouth daily at 12 noon., Disp: , Rfl:    levothyroxine (SYNTHROID, LEVOTHROID) 75 MCG tablet, Take 75 mcg by mouth daily before breakfast. , Disp: , Rfl:    lisinopril (PRINIVIL,ZESTRIL) 20 MG tablet, Take 20 mg by mouth daily., Disp: , Rfl:    Probiotic Product (DAILY PROBIOTIC) CAPS, Take by mouth., Disp: , Rfl:    meloxicam (MOBIC) 7.5 MG tablet, Take 7.5 mg by mouth daily. (Patient not taking: Reported on 05/21/2021), Disp: , Rfl:    Objective:   Vitals:   05/21/21 1125  BP: 128/86  Pulse: 70  SpO2: 99%    Physical Exam Vitals and  nursing note reviewed.  Constitutional:      Appearance: Normal appearance.  HENT:     Head: Normocephalic and atraumatic.  Cardiovascular:     Rate and Rhythm: Normal rate.     Pulses: Normal pulses.  Pulmonary:     Effort: Pulmonary effort is normal.  Abdominal:     General: There is no distension.     Palpations: Abdomen is soft.     Tenderness: There is no abdominal tenderness.  Skin:    General: Skin is warm.     Capillary Refill: Capillary refill takes less than 2 seconds.     Coloration: Skin is not jaundiced or pale.     Findings: Erythema and lesion present. No bruising.  Neurological:     Mental Status: She is alert and oriented to person, place, and time.  Psychiatric:        Mood and Affect: Mood normal.        Behavior: Behavior  normal.        Thought Content: Thought content normal.        Judgment: Judgment normal.    Assessment & Plan:  Sebaceous cyst of breast, right  Plan excision of right breast sebaceous cyst.  Pictures were obtained of the patient and placed in the chart with the patient's or guardian's permission.   [place], DO

## 2021-06-04 ENCOUNTER — Institutional Professional Consult (permissible substitution): Payer: BC Managed Care – PPO | Admitting: Plastic Surgery

## 2021-06-16 ENCOUNTER — Encounter: Payer: Self-pay | Admitting: Plastic Surgery

## 2021-08-09 ENCOUNTER — Telehealth: Payer: Self-pay | Admitting: *Deleted

## 2021-08-09 NOTE — Telephone Encounter (Signed)
Patient called stating that her iron saturation was 26% at her primary care office and wanted to inquire about getting an iron infusion.  Told patient that it has been a year and a half since we saw her and we would have to get her in to see a provider.  Told patient that an iron saturation of 26 may not qualify for an iron infusion in insurance companys eyes anyway.  Patient states she will keep an eye on it and contact us if that number continues to lower

## 2021-08-17 ENCOUNTER — Encounter: Payer: Self-pay | Admitting: Plastic Surgery

## 2021-08-17 ENCOUNTER — Ambulatory Visit: Payer: BC Managed Care – PPO | Admitting: Plastic Surgery

## 2021-08-17 ENCOUNTER — Other Ambulatory Visit: Payer: Self-pay

## 2021-08-17 ENCOUNTER — Other Ambulatory Visit (HOSPITAL_COMMUNITY)
Admission: RE | Admit: 2021-08-17 | Discharge: 2021-08-17 | Disposition: A | Discharge status: Home / Self Care | Payer: BC Managed Care – PPO | Source: Ambulatory Visit | Attending: Plastic Surgery | Admitting: Plastic Surgery

## 2021-08-17 DIAGNOSIS — N6081 Other benign mammary dysplasias of right breast: Secondary | ICD-10-CM | POA: Diagnosis not present

## 2021-08-17 NOTE — Progress Notes (Signed)
Procedure Note ? ?Preoperative Dx: right breast mass ? ?Postoperative Dx: Same ? ?Procedure: Excision of right breast mass 3 cm ? ?Anesthesia: Lidocaine 1% with 1:100,000 epinephrine ? ?Description of Procedure: ?Risks and complications were explained to the patient.  Consent was confirmed and the patient understands the risks and benefits.  The potential complications and alternatives were explained and the patient consents.  The patient expressed understanding the option of not having the procedure and the risks of a scar.  Time out was called and all information was confirmed to be correct.   ? ?The area was prepped and drapped.  Lidocaine 1% with epinepherine was injected in the subcutaneous area.  After waiting several minutes for the local to take affect a #15 blade was used to incise the skin over the lesion. The tissue scissors were used to dissect around the lesion and remove it completely.  ? ?The skin edges were reapproximated with 5-0 Monocryl.  A dressing was applied.  The patient was given instructions on how to care for the area and a follow up appointment.  Alka tolerated the procedure well and there were no complications. The specimen was sent to pathology with the biopsy. ? ?

## 2021-08-17 NOTE — Addendum Note (Signed)
Addended by: Wallace Going on: 08/17/2021 04:06 PM ? ? Modules accepted: Orders ? ?

## 2021-08-19 LAB — SURGICAL PATHOLOGY

## 2021-08-27 ENCOUNTER — Telehealth: Payer: Self-pay

## 2021-08-27 NOTE — Telephone Encounter (Signed)
Patient called to say she had a cyst taken out last week by Dr. Marla Roe.  Patient said she thinks she has popped some stitches and she is bleeding.  She said she isn't sure what she needs to do.  Please call. ?

## 2021-08-27 NOTE — Progress Notes (Signed)
Patient is a 56 year old female with PMH of right breast mass s/p excision performed 08/17/2021 in clinic by Dr. Marla Roe. ? ?I reviewed her procedural note and the skin edges were reapproximated with 5-0 Monocryl.  Pathology revealed benign epidermal inclusion cyst. ? ?Today, patient states that she has been having mild bleeding from underneath her right breast at the excision site for the past 10 days.  She states that she had no issues until she was showering and reaching up to wash her hair.  She then felt pop and was convinced that her suture had ripped.  Since then, she has been applying Vaseline and gauze, changing once daily.  She states that there is only a small spot of blood on the gauze, usually after showering.  No significant bleeding.  She states that though her whole breast hurts and the discomfort radiates towards her back.  ? ?Physical exam is reassuring.  Upon close inspection, does not appear as though the running 5-0 Monocryl has broken.  It appears intact.  No wounds or dehiscence noted.  Good moisture balance.  Skin does not appear macerated or dry.  No ecchymoses or swelling.  Breast is soft to palpation. ? ?Recommend continued management.  Given her discomfort and reported bleeding, recommending that she return in 10 to 14 days for reevaluation.  While she is 2 weeks postop, will leave the sutures in given that they are absorbable and she is concerned for dehiscence.  We will remove at subsequent appointment.  Picture(s) obtained of the patient and placed in the chart were with the patient's or guardian's permission. ? ?

## 2021-08-30 NOTE — Telephone Encounter (Signed)
Patient is returning a call. °

## 2021-08-30 NOTE — Telephone Encounter (Signed)
Called and spoke with the patient regarding the message below.  Patient stated that she spoke with [facility], and she will be seeing [place] on Wednesday.//AB/CMA ?

## 2021-08-30 NOTE — Telephone Encounter (Signed)
Yes, I suspect that would be appropriate management until I see her here in clinic Wednesday.

## 2021-09-01 ENCOUNTER — Other Ambulatory Visit: Payer: Self-pay

## 2021-09-01 ENCOUNTER — Ambulatory Visit: Payer: BC Managed Care – PPO | Admitting: Physician Assistant

## 2021-09-01 DIAGNOSIS — N6081 Other benign mammary dysplasias of right breast: Secondary | ICD-10-CM

## 2021-09-13 NOTE — Progress Notes (Signed)
Patient is a pleasant 56 year old female with PMH of right breast mass s/p excision performed 08/17/2021 in clinic by Dr. Marla Roe. ? ?She was last seen here in clinic on 09/01/2021.  At that time, she felt as though the suture had ripped while showering.  There had been some spotty bleeding, but physical exam was largely reassuring.  No wounds or dehiscence noted.  Plan is for continued conservative management and follow-up in 2 weeks. ? ?Today, patient is doing better.  She states that she has not had any persistent bleeding.  She still has mild drainage intermittently.  Her pain has improved compared to how she was at last visit.  She has been making a concerted effort to keep the inframammary region as dry as possible.  She has been dressing her excision site with antimicrobial gauze, replacing as needed.  She lives in [place], 1.5 hours away. ? ?Physical exam was reassuring.  Excision site does not appear to be infected.  No surrounding erythema or induration.  There were a few scattered sutures that removed here in clinic without complication or difficulty.  Well-tolerated by patient.  No significant drainage on exam. ? ?Continue with daily antimicrobial gauze dressings to help keep the area dry.  Emphasized the importance of avoiding maceration with excess sweating given location of her excision site.  With the summer months approaching, concern for too much moisture.   ? ?Offered for her to return again in 4 weeks given some ongoing drainage and difficulty with complete healing.  She will continue to perform dressing changes.  She will call the clinic should she have any worsening symptoms in the interim. ? ?

## 2021-09-15 ENCOUNTER — Ambulatory Visit: Payer: BC Managed Care – PPO | Admitting: Physician Assistant

## 2021-09-15 ENCOUNTER — Encounter: Payer: Self-pay | Admitting: Physician Assistant

## 2021-09-15 ENCOUNTER — Other Ambulatory Visit: Payer: Self-pay

## 2021-09-15 DIAGNOSIS — N6081 Other benign mammary dysplasias of right breast: Secondary | ICD-10-CM

## 2021-10-01 NOTE — Progress Notes (Signed)
? ?Referring Provider ?Eber Hong, MD ?[place] ?[place] [address]  ? ?CC:  ?Chief Complaint  ?Patient presents with  ? Follow-up  ?   ? ?Stefanie Brown is an 56 y.o. female.  ?HPI: Patient is a pleasant 56 year old female with PMH of right breast mass s/p excision performed 08/17/2021 in clinic by Dr. Marla Roe. ? ?She was last seen here in clinic on 09/15/2021.  At that time, exam was reassuring.  There were a few scattered sutures that were removed in clinic.  She was still experiencing intermittent drainage and discomfort.  Plan was for continued daily antimicrobial gauze dressings to help keep the area dry and avoid maceration. ? ?Today, she states that she will still have scant bloody drainage intermittently from underneath her right breast.  She continues to place gauze underneath her right breast to help with drainage and moisture control.  She will have intermittent sharp/burning pain underneath the breast, suspects possibly neuropathic.  Denies any fevers or swelling. ? ? ? ?Allergies  ?Allergen Reactions  ? Sulfa Antibiotics   ? Sulfamethoxazole-Trimethoprim Hives and Other (See Comments)  ?  Childhood allergy  ? Sulfonamide Derivatives Other (See Comments)  ?  Childhood allergy  ? Varenicline   ? ? ?Outpatient Encounter Medications as of 10/06/2021  ?Medication Sig  ? amLODipine (NORVASC) 5 MG tablet Take 5 mg by mouth daily.  ? atorvastatin (LIPITOR) 20 MG tablet Take 20 mg by mouth daily.  ? calcium-vitamin D (OSCAL WITH D) 500-200 MG-UNIT tablet Take 1 tablet by mouth.  ? cetirizine (ZYRTEC) 10 MG tablet Take 10 mg by mouth daily.  ? lansoprazole (PREVACID) 15 MG capsule Take 15 mg by mouth daily at 12 noon.  ? levothyroxine (SYNTHROID, LEVOTHROID) 75 MCG tablet Take 75 mcg by mouth daily before breakfast.   ? lisinopril (PRINIVIL,ZESTRIL) 20 MG tablet Take 20 mg by mouth daily.  ? meloxicam (MOBIC) 7.5 MG tablet Take 7.5 mg by mouth daily.  ? Probiotic Product (DAILY PROBIOTIC) CAPS  Take by mouth.  ? ?No facility-administered encounter medications on file as of 10/06/2021.  ?  ? ?Past Medical History:  ?Diagnosis Date  ? Anxiety   ? Depression   ? GERD (gastroesophageal reflux disease)   ? History of hiatal hernia   ? Hypothyroidism   ? STD (sexually transmitted disease)   ? HSV 2  ? ? ?Past Surgical History:  ?Procedure Laterality Date  ? BILATERAL SALPINGECTOMY Bilateral 08/31/2015  ? Procedure: BILATERAL SALPINGECTOMY;  Surgeon: Megan Salon, MD;  Location: [facility];  Service: Gynecology;  Laterality: Bilateral;  ? CHOLECYSTECTOMY  2016  ? CYSTO N/A 08/31/2015  ? Procedure: CYSTO;  Surgeon: Megan Salon, MD;  Location: [facility];  Service: Gynecology;  Laterality: N/A;  ? DIAGNOSTIC LAPAROSCOPY    ? DILATION AND CURETTAGE OF UTERUS    ? LAPAROSCOPIC HYSTERECTOMY N/A 08/31/2015  ? Procedure: HYSTERECTOMY TOTAL LAPAROSCOPIC;  Surgeon: Megan Salon, MD;  Location: [facility];  Service: Gynecology;  Laterality: N/A;  ? TUBAL LIGATION Bilateral 2001  ? ? ?Family History  ?Problem Relation Age of Onset  ? Parkinson's disease Mother   ? Cancer Mother   ?     bladder, bladder removed  ? Hypertension Father   ? Heart attack Father   ? ? ?Social History  ? ?Social History Narrative  ? Not on file  ?  ? ?Review of Systems ?General: Denies fevers or chills ?Skin: Denies any worsening redness or purulent drainage ? ?  Physical Exam ? ?  05/21/2021  ? 11:25 AM 12/22/2020  ? 10:17 AM 12/10/2019  ?  3:00 PM  ?Vitals with BMI  ?Height '5\' 8"'$  5' 6.5"   ?Weight 208 lbs 210 lbs 206 lbs  ?BMI 31.63 33.39   ?Systolic 956 387 564  ?Diastolic 86 86 90  ?Pulse 70 91 92  ?  ?General:  No acute distress, nontoxic appearing  ?Respiratory: No increased work of breathing ?Neuro: Alert and oriented ?Psychiatric: Normal mood and affect  ?Skin: 3 cm incision in right inframammary fold, well-healed.  Pinpoint area of persistent wound with no significant drainage or bleeding. ? ?Assessment/Plan ? ?Patient appears to be largely healed from  her incisional wound.  There is a pinpoint area of drainage, but decreased compared to previous encounter.  Exam is entirely reassuring without evidence concerning for cellulitis. ? ?Recommend continued Vaseline and gauze as needed.  At that point, can transition to scar mitigation creams or scar tape. ? ?No specific follow-up needed, but she can certainly call the clinic should she develop any questions or concerns. ? ?Krista Blue ?10/06/2021, 2:31 PM  ? ? ?  ? ?

## 2021-10-06 ENCOUNTER — Ambulatory Visit: Payer: BC Managed Care – PPO | Admitting: Physician Assistant

## 2021-10-06 ENCOUNTER — Encounter: Payer: Self-pay | Admitting: Family

## 2021-10-06 DIAGNOSIS — N6081 Other benign mammary dysplasias of right breast: Secondary | ICD-10-CM

## 2022-02-03 ENCOUNTER — Encounter (HOSPITAL_BASED_OUTPATIENT_CLINIC_OR_DEPARTMENT_OTHER): Payer: Self-pay | Admitting: *Deleted

## 2024-03-12 ENCOUNTER — Encounter (HOSPITAL_BASED_OUTPATIENT_CLINIC_OR_DEPARTMENT_OTHER): Payer: Self-pay | Admitting: Obstetrics & Gynecology
